# Patient Record
Sex: Female | Born: 1975 | Race: White | Hispanic: No | Marital: Married | Smoking: Former smoker
Health system: Southern US, Community
[De-identification: ages and names within clinical notes are randomized; demographics above are authoritative.]

## PROBLEM LIST (undated history)

## (undated) DIAGNOSIS — C801 Malignant (primary) neoplasm, unspecified: Secondary | ICD-10-CM

## (undated) HISTORY — PX: TONSILLECTOMY: SUR1361

## (undated) HISTORY — PX: LEFT OOPHORECTOMY: SHX1961

## (undated) HISTORY — PX: APPENDECTOMY: SHX54

## (undated) HISTORY — PX: DILATION AND CURETTAGE OF UTERUS: SHX78

---

## 2012-12-15 ENCOUNTER — Encounter: Payer: Self-pay | Admitting: Maternal and Fetal Medicine

## 2013-02-09 ENCOUNTER — Encounter: Payer: Self-pay | Admitting: Maternal and Fetal Medicine

## 2013-04-02 LAB — PIH PROFILE
Anion Gap: 9 (ref 7–16)
BUN: 4 mg/dL — ABNORMAL LOW (ref 7–18)
Calcium, Total: 8.9 mg/dL (ref 8.5–10.1)
Co2: 20 mmol/L — ABNORMAL LOW (ref 21–32)
Creatinine: 0.56 mg/dL — ABNORMAL LOW (ref 0.60–1.30)
EGFR (African American): >60
Glucose: 91 mg/dL (ref 65–99)
HGB: 12.8 g/dL (ref 12.0–16.0)
MCH: 33 pg (ref 26.0–34.0)
MCHC: 35.7 g/dL (ref 32.0–36.0)
MCV: 93 fL (ref 80–100)
Osmolality: 270 (ref 275–301)
Potassium: 3.4 mmol/L — ABNORMAL LOW (ref 3.5–5.1)
RBC: 3.86 10*6/uL (ref 3.80–5.20)
RDW: 14.2 % (ref 11.5–14.5)
SGOT(AST): 12 U/L — ABNORMAL LOW (ref 15–37)
Uric Acid: 4.6 mg/dL (ref 2.6–6.0)

## 2013-04-02 LAB — PROTEIN / CREATININE RATIO, URINE: Creatinine, Urine: 131.6 mg/dL — ABNORMAL HIGH (ref 30.0–125.0)

## 2013-04-04 ENCOUNTER — Inpatient Hospital Stay: Payer: Self-pay | Admitting: Obstetrics and Gynecology

## 2013-04-04 LAB — CBC WITH DIFFERENTIAL/PLATELET
Basophil #: 0.1 10*3/uL (ref 0.0–0.1)
Basophil %: 0.9 %
Eosinophil #: 0.1 10*3/uL (ref 0.0–0.7)
Eosinophil %: 1 %
HCT: 38.1 % (ref 35.0–47.0)
Lymphocyte #: 2.3 10*3/uL (ref 1.0–3.6)
MCH: 32.9 pg (ref 26.0–34.0)
MCV: 93 fL (ref 80–100)
Monocyte #: 0.4 x10 3/mm (ref 0.2–0.9)
Monocyte %: 3.9 %
Neutrophil #: 7.5 10*3/uL — ABNORMAL HIGH (ref 1.4–6.5)
Platelet: 168 10*3/uL (ref 150–440)
RBC: 4.1 10*6/uL (ref 3.80–5.20)
RDW: 14.1 % (ref 11.5–14.5)

## 2013-04-04 LAB — PROTEIN, URINE, 24 HOUR
Collection Hours: 24 hours
Protein, 24 Hour Urine: 378 mg/24HR — ABNORMAL HIGH (ref 30–149)
Protein, Urine: 18 mg/dL (ref 0–12)

## 2013-04-07 LAB — PATHOLOGY REPORT

## 2013-10-14 ENCOUNTER — Ambulatory Visit: Payer: Self-pay | Admitting: Physician Assistant

## 2013-10-14 LAB — RAPID STREP-A WITH REFLX: Micro Text Report: NEGATIVE

## 2013-10-15 LAB — BETA STREP CULTURE(ARMC)

## 2014-05-07 LAB — OB RESULTS CONSOLE HEPATITIS B SURFACE ANTIGEN: HEP B S AG: NEGATIVE

## 2014-05-07 LAB — OB RESULTS CONSOLE ABO/RH: RH TYPE: NEGATIVE

## 2014-05-07 LAB — OB RESULTS CONSOLE VARICELLA ZOSTER ANTIBODY, IGG: VARICELLA IGG: IMMUNE

## 2014-05-07 LAB — OB RESULTS CONSOLE RUBELLA ANTIBODY, IGM: RUBELLA: IMMUNE

## 2014-06-04 NOTE — L&D Delivery Note (Signed)
Delivery Note At 8:20 AM a viable female was delivered via C-Section, Low Transverse (Presentation: cephalic).  APGAR: 9, 9; weight 9 lb 4.5 oz (4210 g).   Placenta status: Intact, Manual removal.  Cord: 3 vessels with the following complications: None.  Cord pH: not obtained  Anesthesia: Spinal  Episiotomy:   Lacerations:   Suture Repair: n/a Est. Blood Loss (mL):  800 mL  Mom to postpartum.  Baby to Couplet care / Skin to Skin.  Will Bonnet, MD, [place] 12/07/2014 9:38 AM

## 2014-09-24 NOTE — Op Note (Signed)
PATIENT NAME:  Marissa Rose, Marissa Rose MR#:  [id number] DATE OF BIRTH:  05-01-1976  DATE OF PROCEDURE:  04/04/2013  PREOPERATIVE DIAGNOSIS: Prior cesarean section and mild preeclampsia. Down syndrome.   POSTOPERATIVE DIAGNOSIS: Prior cesarean section and mild preeclampsia. Down syndrome.   PROCEDURE: Repeat low transverse cesarean section.   ANESTHESIA:  Spinal.  SURGEON: Donzetta Matters, M.D.   ASSISTANT: Roslyn Smiling, scrub tech.   ESTIMATED BLOOD LOSS: 600 mL   FLUIDS: 1500 mL.   COMPLICATIONS: None.   FINDINGS: Double footling breech female infant with Down syndrome features, 3147 grams, Apgars 7 and 9. Dense adhesions of anterior uterine serosa to bladder and anterior abdominal wall, requiring sharp and blunt dissection.   SPECIMEN: Placenta.   INDICATIONS: The patient is a 39 year old who presents to the hospital with elevated blood pressure. She was monitored, and 24-hour urine was collected, which resulted at 378, indicating a diagnosis of mild preeclampsia. Decision was made to proceed towards delivery because of this. Risks, benefits, indications, and alternatives of the procedure were explained, and informed consent was obtained.   PROCEDURE: The patient was taken to the Operating Room with IV fluids running. She was prepped and draped in the usual sterile fashion with leftward tilt. A Pfannenstiel skin incision was made, carried down to underlying fascia with the knife. The fascia was nicked in the midline. The incision was extended laterally. The superior aspect of the fascia was grasped with Kocher clamps, and the underlying rectus muscles were dissected off with difficulty, using Mayo scissors. This was repeated on the inferior fascia.   The rectus muscles were somewhat divided in the midline at the time of the removal of the underlying rectus muscles off from the superior fascia. Kelly clamps were placed on the underlying peritoneum, and the peritoneum was entered sharply with  the Metzenbaum scissors. The opening was extended with some difficulty, secondary to prior adhesions. The uterus was somewhat clear of adhesions superiorly, however, at the lower uterine segment, the uterine serosa was severely adhesed to the anterior abdominal wall as well as the bladder.  The vesicouterine peritoneum was identified with difficulty and entered with the Metzenbaums, and the bladder flap was created digitally. The bladder blade was then placed. The hysterotomy incision was made. The placenta was encountered, as was a fair amount of bleeding. The infant's  feet were grasped as the membranes were ruptured. The feet were delivered and the infant was pulled out and delivered to the inferior border of the scapula. The left arm was delivered, and the infant was rotated 180 degrees, and the right arm was delivered.   The maxillary prominence was grasped. The neck was flexed, and the infant's head was delivered. It was at this time that Down syndrome features were noted. The cord was clamped x2 and cut and the infant was handed to the waiting nursery staff. The cord blood was collected. The placenta was expressed. The uterus was cleared of all clot and debris after being exteriorized.  The hysterotomy incision was repaired in a running locked fashion with a #0 Monocryl. The serosa the anterior uterus had been somewhat sheared off secondary to adhesions, and this was repaired in two layers using a #0 Monocryl in a running fashion. All surgical areas were examined and were found to be hemostatic. The uterus was returned to the abdomen. The abdomen and gutters were irrigated with copious amounts of warm normal saline. A piece of Interceed was placed to help prevent further adhesion formation.   The  rectus muscles were reapproximated with #2-0 Vicryl. The On-Q pump apparatus was placed according to manufacturer's instructions. The PDS was used to close the fascia. This was also done with some difficulty,  secondary to ragged edges of the fascia because of adhesions on the right side of the incision, and opening remained in the fascia, and this was eventually closed using a second #1 PDS suture.   The On-Q pump catheters were each bolused with 5 mL with 0.5% bupivacaine. The openings through which the On-Q catheters exited the abdomen were sealed using Dermabond skin glue. The subcutaneous fat was reapproximated using #2 plain gut in an interrupted fashion, and the skin was closed with staples. The On-Q pump catheters were secured to the patient's abdomen using Steri-Strips and Tegaderm. The patient tolerated the procedure well. Sponge, needle and instrument counts were correct x2. The patient was taken to the recovery room in stable condition.    ____________________________ Marissa Gala Ferne Reus, MD law:cg D: 04/04/2013 18:41:15 ET T: 04/05/2013 00:36:26 ET JOB#: [id number]  cc: Sherlynn Carbon A. Ferne Reus, MD, <Dictator> Renda Rolls LEE MD ELECTRONICALLY SIGNED 04/07/2013 13:36

## 2014-09-28 LAB — OB RESULTS CONSOLE HIV ANTIBODY (ROUTINE TESTING): HIV: NONREACTIVE

## 2014-09-28 LAB — OB RESULTS CONSOLE RPR: RPR: NONREACTIVE

## 2014-10-12 NOTE — H&P (Signed)
L&D Evaluation:  History:  HPI 39 year old G5 P2022 with EDC=04/25/2013 by a 10wk5 day ultrasound presents with c/o headache x 24 hours unrelieved with Tylenol and elevated blood pressures at the pharmacy tonight to 150s/90s. Headache is in parietal area bilaterally and is accompanied by photophobia and nausea and lightheadedness. Rated headache pain 5/10 on arrival and 2/10 after 2 Vicodin. No scotomata. Complains of edema and weight gain recently. No hx of migraines. Has had a little sinus congestion, but no facial pain/ sore throat.Baby active.Prenatal care also remarkable for hx of prior C-section x2, AMA, a NT of 4.6 mm, with normal anatomy scan, obesity, and an elevated glucola of 159 with two normal 3hr GTTs. There were concerns for possible vasa previa on earlier ultraosund but scan on 09/08 did not show any signs of a vasa previa, but there is an accessory lobe of the placenta posteriorly.   Presents with headache and elevated blood pressures   Patient's Medical History No Chronic Illness    Patient's Surgical History Appendectomy  D&C  Previous C-Section  oophorectomy, wisdom teeth extraction.   Medications Pre Natal Vitamins  folic acid, Baby ASA, Tylenol 500 mgm (last dose 1 PM), Tums   Allergies Sulfa   Social History none   Family History Non-Contributory   ROS:  ROS see HPI   Exam:  Vital Signs 130s-140s/70s to 80s   Urine Protein 1+, P/C=274 mgm   General no apparent distress   Mental Status clear   Chest clear   Heart normal sinus rhythm, no murmur/gallop/rubs   Abdomen gravid, non-tender   Estimated Fetal Weight Average for gestational age   Fetal Position breech   Edema 1+   Reflexes +2 to +3   Clonus negative   Mebranes Intact, AFI=13cm+   FHT normal rate with no decels   Ucx rare   Impression:  Impression IUP at 36 5/7 weeks with gestational hypertension. R/O preeclampsia. Prior C-section x2   Plan:  Plan Discussed case with Dr  Glennon Mac.   Electronic Signatures: Karene Fry (CNM)  (Signed 30-Oct-14 22:55)  Authored: L&D Evaluation   Last Updated: 30-Oct-14 22:55 by Karene Fry (CNM)

## 2014-11-17 LAB — OB RESULTS CONSOLE GBS: GBS: NEGATIVE

## 2014-12-03 ENCOUNTER — Encounter
Admission: RE | Admit: 2014-12-03 | Discharge: 2014-12-03 | Disposition: A | Discharge status: Home / Self Care | Payer: 59 | Source: Ambulatory Visit | Attending: Obstetrics and Gynecology | Admitting: Obstetrics and Gynecology

## 2014-12-03 DIAGNOSIS — Z01812 Encounter for preprocedural laboratory examination: Secondary | ICD-10-CM | POA: Insufficient documentation

## 2014-12-03 HISTORY — DX: Malignant (primary) neoplasm, unspecified: C80.1

## 2014-12-03 LAB — CBC
HCT: 36.2 % (ref 35.0–47.0)
HEMOGLOBIN: 12.7 g/dL (ref 12.0–16.0)
MCH: 33.4 pg (ref 26.0–34.0)
MCHC: 35.1 g/dL (ref 32.0–36.0)
MCV: 95.3 fL (ref 80.0–100.0)
Platelets: 177 10*3/uL (ref 150–440)
RBC: 3.8 MIL/uL (ref 3.80–5.20)
RDW: 14 % (ref 11.5–14.5)
WBC: 9 10*3/uL (ref 3.6–11.0)

## 2014-12-03 LAB — RAPID HIV SCREEN (HIV 1/2 AB+AG)
HIV 1/2 ANTIBODIES: NONREACTIVE
HIV-1 P24 ANTIGEN - HIV24: NONREACTIVE

## 2014-12-03 LAB — ABO/RH: ABO/RH(D): A NEG

## 2014-12-03 NOTE — Patient Instructions (Signed)
Marissa Rose   Your procedure is scheduled on: 12/07/14   Report to [facility] at 5:30 AM.             [facility]             [place], [place] [address]  Call this number if you have problems the morning of surgery: [phone]   Remember:   Do not eat food or drink liquids after midnight.     Do not wear jewelry, make-up or nail polish.  Do not wear lotions, powders, or perfumes. You may wear deodorant.  Do not shave prior to surgery.  Do not bring valuables to the hospital.  [facility] is not responsible for any                        belongings or valuables.                Contacts, dentures or bridgework may not be worn into surgery.  Leave suitcase in the car. After surgery it may be brought to your room.  For patients admitted to the hospital, discharge time is determined by your             treatment team.               Special Instructions: Preparing the skin before Cesarean Section              To help prevent the risk of infection at your surgical site, we are providing             you with rinse-free Sage 2% Chlorhexidine Gluconate (HCG) disposable             wipes.               The night before surgery:              1. Shower or bathe with warm water             2. Do not apply lotion or perfume             3. Wait one hour after shower, skin should be dry and cool             4. Open Sage wipe package - 2 disposable cloths are inside             5. Wipe the lower abdomen from the pubic line to the navel and hip bone to hip             bone with one cloth             6. Use the second cloth to wipe the front of the upper thighs             7. Allow the area to dry for one minute. DO NOT RINSE             8. Skin may feel "tacky" for several minutes             9. Dress in freshly laundered, clean clothes           10. Do not shower the morning of surgery      Cesarean Delivery  Cesarean  delivery is the birth of a baby through a  cut (incision) in the abdomen and womb (uterus).  LET [facility] CARE PROVIDER KNOW ABOUT:  All medicines you are taking, including vitamins, herbs, eye drops, creams, and over-the-counter medicines.  Previous problems you or members of your family have had with the use of anesthetics.  Any blood disorders you have.  Previous surgeries you have had.  Medical conditions you have.  Any allergies you have.  Complicationsinvolving the pregnancy. RISKS AND COMPLICATIONS  Generally, this is a safe procedure. However, as with any procedure, complications can occur. Possible complications include:  Bleeding.  Infection.  Blood clots.  Injury to surrounding organs.  Problems with anesthesia.  Injury to the baby. BEFORE THE PROCEDURE   You may be given an antacid medicine to drink. This will prevent acid contents in your stomach from going into your lungs if you vomit during the surgery.  You may be given an antibiotic medicine to prevent infection. PROCEDURE   Hair may be removed from your pubic area and your lower abdomen. This is to prevent infection in the incision site.  A tube (Foley catheter) will be placed in your bladder to drain your urine from your bladder into a bag. This keeps your bladder empty during surgery.  An IV tube will be placed in your vein.  You may be given medicine to numb the lower half of your body (regional anesthetic). If you were in labor, you may have already had an epidural in place which can be used in both labor and cesarean delivery. You may possibly be given medicine to make you sleep (general anesthetic) though this is not as common.  An incision will be made in your abdomen that extends to your uterus. There are 2 basic kinds of incisions:  The horizontal (transverse) incision. Horizontal incisions are from side to side and are used for most routine cesarean deliveries.  The vertical incision.  The vertical incision is from the top of the abdomen to the bottom and is less commonly used. It is often done for women who have a serious complication (extreme prematurity) or under emergency situations.  The horizontal and vertical incisions may both be used at the same time. However, this is very uncommon.  An incision is then made in your uterus to deliver the baby.  Your baby will then be delivered.  Both incisions are then closed with absorbable stitches. AFTER THE PROCEDURE   If you were awake during the surgery, you will see your baby right away. If you were asleep, you will see your baby as soon as you are awake.  You may breastfeed your baby after surgery.  You may be able to get up and walk the same day as the surgery. If you need to stay in bed for a period of time, you will receive help to turn, cough, and take deep breaths after surgery. This helps prevent lung problems such as pneumonia.  Do not get out of bed alone the first time after surgery. You will need help getting out of bed until you are able to do this by yourself.  You may be able to shower the day after your cesarean delivery. After the bandage (dressing) is taken off the incision site, a nurse will assist you to shower if you would like help.  You will have pneumatic compression hose placed on your lower legs. This is done to prevent blood clots. When you are up and walking regularly, they will no longer be necessary.  Do not cross  your legs when you sit.  Save any blood clots that you pass. If you pass a clot while on the toilet, do not flush it. Call for the nurse. Tell the nurse if you think you are bleeding too much or passing too many clots.  You will be given medicine as needed. Let your health care providers know if you are hurting. You may also be given an antibiotic to prevent an infection.  Your IV tube will be taken out when you are drinking a reasonable amount of fluids. The Foley catheter is  taken out when you are up and walking.  If your blood type is Rh negative and your baby's blood type is Rh positive, you will be given a shot of anti-D immune globulin. This shot prevents you from having Rh problems with a future pregnancy. You should get the shot even if you had your tubes tied (tubal ligation).  If you are allowed to take the baby for a walk, place the baby in the bassinet and push it. Do not carry your baby in your arms. Document Released: 05/21/2005 Document Revised: 03/11/2013 Document Reviewed: 12/10/2012 [employer] [facility] Patient Information 2015 [place], [place]. This information is not intended to replace advice given to you by your health care provider. Make sure you discuss any questions you have with your health care provider.

## 2014-12-04 LAB — RPR: RPR Ser Ql: NONREACTIVE

## 2014-12-07 ENCOUNTER — Inpatient Hospital Stay: Payer: 59 | Admitting: Certified Registered"

## 2014-12-07 ENCOUNTER — Encounter
Admission: RE | Disposition: A | Discharge status: Home / Self Care | Payer: Self-pay | Source: Ambulatory Visit | Attending: Obstetrics and Gynecology

## 2014-12-07 ENCOUNTER — Encounter: Payer: Self-pay | Admitting: Anesthesiology

## 2014-12-07 ENCOUNTER — Inpatient Hospital Stay
Admission: RE | Admit: 2014-12-07 | Discharge: 2014-12-10 | DRG: 766 | Disposition: A | Discharge status: Home / Self Care | Payer: 59 | Source: Ambulatory Visit | Attending: Obstetrics and Gynecology | Admitting: Obstetrics and Gynecology

## 2014-12-07 DIAGNOSIS — O0990 Supervision of high risk pregnancy, unspecified, unspecified trimester: Secondary | ICD-10-CM

## 2014-12-07 DIAGNOSIS — O3421 Maternal care for scar from previous cesarean delivery: Principal | ICD-10-CM | POA: Diagnosis present

## 2014-12-07 DIAGNOSIS — Z302 Encounter for sterilization: Secondary | ICD-10-CM | POA: Diagnosis not present

## 2014-12-07 DIAGNOSIS — O34219 Maternal care for unspecified type scar from previous cesarean delivery: Secondary | ICD-10-CM | POA: Diagnosis present

## 2014-12-07 DIAGNOSIS — O99213 Obesity complicating pregnancy, third trimester: Secondary | ICD-10-CM | POA: Diagnosis present

## 2014-12-07 DIAGNOSIS — E669 Obesity, unspecified: Secondary | ICD-10-CM | POA: Diagnosis present

## 2014-12-07 DIAGNOSIS — Z3A39 39 weeks gestation of pregnancy: Secondary | ICD-10-CM | POA: Diagnosis present

## 2014-12-07 DIAGNOSIS — O99214 Obesity complicating childbirth: Secondary | ICD-10-CM | POA: Diagnosis present

## 2014-12-07 DIAGNOSIS — Z6837 Body mass index (BMI) 37.0-37.9, adult: Secondary | ICD-10-CM | POA: Diagnosis not present

## 2014-12-07 DIAGNOSIS — Z98891 History of uterine scar from previous surgery: Secondary | ICD-10-CM

## 2014-12-07 DIAGNOSIS — N858 Other specified noninflammatory disorders of uterus: Secondary | ICD-10-CM | POA: Diagnosis present

## 2014-12-07 LAB — CBC
HCT: 34.6 % — ABNORMAL LOW (ref 35.0–47.0)
Hemoglobin: 12.3 g/dL (ref 12.0–16.0)
MCH: 33.9 pg (ref 26.0–34.0)
MCHC: 35.5 g/dL (ref 32.0–36.0)
MCV: 95.5 fL (ref 80.0–100.0)
PLATELETS: 166 10*3/uL (ref 150–440)
RBC: 3.62 MIL/uL — AB (ref 3.80–5.20)
RDW: 13.7 % (ref 11.5–14.5)
WBC: 8.3 10*3/uL (ref 3.6–11.0)

## 2014-12-07 SURGERY — Surgical Case
Anesthesia: Spinal | Laterality: Right | Wound class: Clean Contaminated

## 2014-12-07 SURGERY — Surgical Case

## 2014-12-07 MED ORDER — CITRIC ACID-SODIUM CITRATE 334-500 MG/5ML PO SOLN
30.0000 mL | Freq: Once | ORAL | Status: AC
  Administered 2014-12-07: 30 mL via ORAL

## 2014-12-07 MED ORDER — FENTANYL CITRATE (PF) 100 MCG/2ML IJ SOLN
INTRAMUSCULAR | Status: AC
  Administered 2014-12-07: 25 ug via INTRAVENOUS
  Filled 2014-12-07: qty 2

## 2014-12-07 MED ORDER — BUPIVACAINE 0.25 % ON-Q PUMP DUAL CATH 400 ML: 400.0000 mL | INJECTION | Status: DC

## 2014-12-07 MED ORDER — MENTHOL 3 MG MT LOZG
1.0000 | LOZENGE | OROMUCOSAL | Status: DC | PRN
  Filled 2014-12-07: qty 9

## 2014-12-07 MED ORDER — LACTATED RINGERS IV SOLN
INTRAVENOUS | Status: DC
  Administered 2014-12-07 – 2014-12-08 (×2): via INTRAVENOUS

## 2014-12-07 MED ORDER — DIPHENHYDRAMINE HCL 25 MG PO CAPS
25.0000 mg | ORAL_CAPSULE | Freq: Four times a day (QID) | ORAL | Status: DC | PRN
  Administered 2014-12-07: 25 mg via ORAL
  Filled 2014-12-07: qty 1

## 2014-12-07 MED ORDER — BUPIVACAINE HCL 0.5 % IJ SOLN: 5.0000 mL | Freq: Once | INTRAMUSCULAR | Status: DC

## 2014-12-07 MED ORDER — WITCH HAZEL-GLYCERIN EX PADS
1.0000 "application " | MEDICATED_PAD | CUTANEOUS | Status: DC | PRN
  Administered 2014-12-08: 1 via TOPICAL
  Filled 2014-12-07: qty 100

## 2014-12-07 MED ORDER — LANOLIN HYDROUS EX OINT: 1.0000 "application " | TOPICAL_OINTMENT | CUTANEOUS | Status: DC | PRN

## 2014-12-07 MED ORDER — BUPIVACAINE HCL (PF) 0.5 % IJ SOLN
INTRAMUSCULAR | Status: AC
  Filled 2014-12-07: qty 30

## 2014-12-07 MED ORDER — OXYTOCIN 40 UNITS IN LACTATED RINGERS INFUSION - SIMPLE MED: 62.5000 mL/h | INTRAVENOUS | Status: AC

## 2014-12-07 MED ORDER — ONDANSETRON HCL 4 MG/2ML IJ SOLN
INTRAMUSCULAR | Status: DC | PRN
  Administered 2014-12-07: 4 mg via INTRAVENOUS

## 2014-12-07 MED ORDER — CEFAZOLIN SODIUM-DEXTROSE 2-3 GM-% IV SOLR
INTRAVENOUS | Status: AC
  Administered 2014-12-07: 2 g via INTRAVENOUS
  Filled 2014-12-07: qty 50

## 2014-12-07 MED ORDER — LACTATED RINGERS IV SOLN
INTRAVENOUS | Status: DC | PRN
  Administered 2014-12-07 (×2): via INTRAVENOUS

## 2014-12-07 MED ORDER — PRENATAL MULTIVITAMIN CH
1.0000 | ORAL_TABLET | Freq: Every day | ORAL | Status: DC
  Administered 2014-12-07 – 2014-12-09 (×3): 1 via ORAL
  Filled 2014-12-07 (×3): qty 1

## 2014-12-07 MED ORDER — PHENYLEPHRINE HCL 10 MG/ML IJ SOLN
INTRAMUSCULAR | Status: DC | PRN
  Administered 2014-12-07 (×10): 100 ug via INTRAVENOUS

## 2014-12-07 MED ORDER — SIMETHICONE 80 MG PO CHEW
80.0000 mg | CHEWABLE_TABLET | Freq: Three times a day (TID) | ORAL | Status: DC
  Administered 2014-12-07 – 2014-12-10 (×4): 80 mg via ORAL
  Filled 2014-12-07 (×4): qty 1

## 2014-12-07 MED ORDER — OXYTOCIN 40 UNITS IN LACTATED RINGERS INFUSION - SIMPLE MED
INTRAVENOUS | Status: DC | PRN
  Administered 2014-12-07: 500 mL via INTRAVENOUS

## 2014-12-07 MED ORDER — BUPIVACAINE 0.25 % ON-Q PUMP DUAL CATH 400 ML
INJECTION | Status: AC
  Filled 2014-12-07: qty 400

## 2014-12-07 MED ORDER — HYDROCODONE-ACETAMINOPHEN 5-325 MG PO TABS: 1.0000 | ORAL_TABLET | ORAL | Status: DC | PRN

## 2014-12-07 MED ORDER — ONDANSETRON HCL 4 MG/2ML IJ SOLN: 4.0000 mg | Freq: Once | INTRAMUSCULAR | Status: DC | PRN

## 2014-12-07 MED ORDER — BUPIVACAINE IN DEXTROSE 0.75-8.25 % IT SOLN
INTRATHECAL | Status: DC | PRN
  Administered 2014-12-07: 1.7 mL via INTRATHECAL

## 2014-12-07 MED ORDER — CITRIC ACID-SODIUM CITRATE 334-500 MG/5ML PO SOLN
ORAL | Status: AC
  Administered 2014-12-07: 30 mL via ORAL
  Filled 2014-12-07: qty 15

## 2014-12-07 MED ORDER — CEFAZOLIN SODIUM-DEXTROSE 2-3 GM-% IV SOLR
2.0000 g | INTRAVENOUS | Status: AC
  Administered 2014-12-07: 2 g via INTRAVENOUS

## 2014-12-07 MED ORDER — DIBUCAINE 1 % RE OINT
1.0000 | TOPICAL_OINTMENT | RECTAL | Status: DC | PRN
Start: 2014-12-07 — End: 2014-12-10

## 2014-12-07 MED ORDER — HYDROCODONE-ACETAMINOPHEN 5-325 MG PO TABS
1.0000 | ORAL_TABLET | ORAL | Status: DC | PRN
  Administered 2014-12-07: 2 via ORAL
  Administered 2014-12-07: 1 via ORAL
  Administered 2014-12-08 – 2014-12-10 (×13): 2 via ORAL
  Filled 2014-12-07 (×8): qty 2
  Filled 2014-12-07: qty 1
  Filled 2014-12-07 (×6): qty 2

## 2014-12-07 MED ORDER — SENNOSIDES-DOCUSATE SODIUM 8.6-50 MG PO TABS
2.0000 | ORAL_TABLET | ORAL | Status: DC
  Administered 2014-12-08 – 2014-12-09 (×2): 2 via ORAL
  Filled 2014-12-07 (×3): qty 2

## 2014-12-07 MED ORDER — MORPHINE SULFATE (PF) 0.5 MG/ML IJ SOLN
INTRAMUSCULAR | Status: DC | PRN
  Administered 2014-12-07: .2 mg via EPIDURAL

## 2014-12-07 MED ORDER — FENTANYL CITRATE (PF) 100 MCG/2ML IJ SOLN
25.0000 ug | INTRAMUSCULAR | Status: DC | PRN
  Administered 2014-12-07 (×3): 25 ug via INTRAVENOUS

## 2014-12-07 MED ORDER — IBUPROFEN 600 MG PO TABS
600.0000 mg | ORAL_TABLET | Freq: Four times a day (QID) | ORAL | Status: DC | PRN
  Administered 2014-12-07 – 2014-12-10 (×10): 600 mg via ORAL
  Filled 2014-12-07 (×10): qty 1

## 2014-12-07 SURGICAL SUPPLY — 30 items
CANISTER SUCT 3000ML (MISCELLANEOUS) ×3 IMPLANT
CATH KIT ON-Q SILVERSOAK 5IN (CATHETERS) ×6 IMPLANT
CHLORAPREP W/TINT 26ML (MISCELLANEOUS) ×6 IMPLANT
CLOSURE WOUND 1/2 X4 (GAUZE/BANDAGES/DRESSINGS) ×1
CUP MEDICINE 2OZ PLAST GRAD ST (MISCELLANEOUS) ×3 IMPLANT
DRSG TELFA 3X8 NADH (GAUZE/BANDAGES/DRESSINGS) ×3 IMPLANT
GAUZE SPONGE 4X4 12PLY STRL (GAUZE/BANDAGES/DRESSINGS) ×3 IMPLANT
GLOVE BIO SURGEON STRL SZ8 (GLOVE) ×6 IMPLANT
GLOVE BIOGEL PI IND STRL 7.5 (GLOVE) ×2 IMPLANT
GLOVE BIOGEL PI INDICATOR 7.5 (GLOVE) ×4
GLOVE SKINSENSE NS SZ7.0 (GLOVE) ×4
GLOVE SKINSENSE STRL SZ7.0 (GLOVE) ×2 IMPLANT
GOWN STRL REUS W/ TWL LRG LVL3 (GOWN DISPOSABLE) ×2 IMPLANT
GOWN STRL REUS W/TWL LRG LVL3 (GOWN DISPOSABLE) ×4
LIQUID BAND (GAUZE/BANDAGES/DRESSINGS) ×3 IMPLANT
NDL SAFETY 22GX1.5 (NEEDLE) ×3 IMPLANT
NS IRRIG 1000ML POUR BTL (IV SOLUTION) ×3 IMPLANT
PACK C SECTION AR (MISCELLANEOUS) ×3 IMPLANT
PAD GROUND ADULT SPLIT (MISCELLANEOUS) ×3 IMPLANT
PAD OB MATERNITY 4.3X12.25 (PERSONAL CARE ITEMS) ×6 IMPLANT
PAD PREP 24X41 OB/GYN DISP (PERSONAL CARE ITEMS) ×3 IMPLANT
STRIP CLOSURE SKIN 1/2X4 (GAUZE/BANDAGES/DRESSINGS) ×2 IMPLANT
SUT 2-0 PL GUT LIGAPAK (SUTURE) ×3 IMPLANT
SUT MAXON ABS #0 GS21 30IN (SUTURE) IMPLANT
SUT MNCRL AB 4-0 PS2 18 (SUTURE) IMPLANT
SUT PLAIN 2 0 XLH (SUTURE) ×3 IMPLANT
SUT VIC AB 0 CT1 36 (SUTURE) ×12 IMPLANT
SUT VIC AB 1 CT1 36 (SUTURE) IMPLANT
SWABSTK COMLB BENZOIN TINCTURE (MISCELLANEOUS) ×3 IMPLANT
SYRINGE 10CC LL (SYRINGE) ×3 IMPLANT

## 2014-12-07 SURGICAL SUPPLY — 27 items
CANISTER SUCT 3000ML (MISCELLANEOUS) IMPLANT
CATH KIT ON-Q SILVERSOAK 5IN (CATHETERS) IMPLANT
CLOSURE WOUND 1/2 X4 (GAUZE/BANDAGES/DRESSINGS)
DRSG TELFA 3X8 NADH (GAUZE/BANDAGES/DRESSINGS) IMPLANT
ELECT CAUTERY BLADE 6.4 (BLADE) IMPLANT
GAUZE SPONGE 4X4 12PLY STRL (GAUZE/BANDAGES/DRESSINGS) IMPLANT
GLOVE BIO SURGEON STRL SZ7 (GLOVE) IMPLANT
GLOVE INDICATOR 7.5 STRL GRN (GLOVE) IMPLANT
GOWN STRL REUS W/ TWL LRG LVL3 (GOWN DISPOSABLE) IMPLANT
GOWN STRL REUS W/TWL LRG LVL3 (GOWN DISPOSABLE)
LIQUID BAND (GAUZE/BANDAGES/DRESSINGS) IMPLANT
NS IRRIG 1000ML POUR BTL (IV SOLUTION) IMPLANT
PACK C SECTION AR (MISCELLANEOUS) IMPLANT
PAD GROUND ADULT SPLIT (MISCELLANEOUS) IMPLANT
PAD OB MATERNITY 4.3X12.25 (PERSONAL CARE ITEMS) IMPLANT
PAD PREP 24X41 OB/GYN DISP (PERSONAL CARE ITEMS) IMPLANT
SPONGE LAP 18X18 5 PK (GAUZE/BANDAGES/DRESSINGS) IMPLANT
STRIP CLOSURE SKIN 1/2X4 (GAUZE/BANDAGES/DRESSINGS) IMPLANT
SUT CHROMIC GUT BROWN 0 54 (SUTURE) IMPLANT
SUT CHROMIC GUT BROWN 0 54IN (SUTURE)
SUT MNCRL 4-0 (SUTURE)
SUT MNCRL 4-0 27XMFL (SUTURE)
SUT PDS AB 1 TP1 96 (SUTURE) IMPLANT
SUT PLAIN 2 0 XLH (SUTURE) IMPLANT
SUT VIC AB 0 CT1 36 (SUTURE) IMPLANT
SUTURE MNCRL 4-0 27XMF (SUTURE) IMPLANT
SWABSTK COMLB BENZOIN TINCTURE (MISCELLANEOUS) IMPLANT

## 2014-12-07 NOTE — Op Note (Addendum)
Cesarean Section Procedure Note   Marissa Rose   12/07/2014   Pre-operative Diagnosis: 1) intrauterine pregnancy at [redacted]w[redacted]d gestational age, 2) history of prior cesarean delivery, desires repeat, 3) obesity with bmi of 36, 4) multiparity, desires permanent sterility   Post-operative Diagnosis: 1) intrauterine pregnancy at [redacted]w[redacted]d gestational age, 2) history of prior cesarean delivery, desires repeat, 3) obesity with bmi of 49, 4) multiparity, desires permanent sterility   Procedures: 1) Repeat low transverse cesarean section 2) right tubal ligation  Surgeon: Surgeon(s) and Role:    * Will Bonnet, MD - Primary    * Aletha Halim, MD - Assisting   Anesthesia: spinal   Findings:  1) normal appearing gravid uterus with some mild adhesions of bladder to lower uterine segment, mild adhesions of peritoneum to left uterus, absent left fallopian tube with normal appearing right fallopian tube, and normal appearing right ovary 2) viable female infant   Estimated Blood Loss: 800 mL  Total IV Fluids: 1,500 ml   Urine Output: 200 mL clear urine at end of procedure  Specimens: segment of right fallopian tube for permanent  Complications: no complications  Disposition: PACU - hemodynamically stable.   Maternal Condition: stable   Baby condition / location:  Couplet care / Skin to Skin  Procedure Details:  The patient was seen in the Holding Room. The risks, benefits, complications, treatment options, and expected outcomes were discussed with the patient. The patient concurred with the proposed plan, giving informed consent. identified as Marissa Rose and the procedure verified as C-Section Delivery and tubal ligation. A Time Out was held and the above information confirmed.   After induction of anesthesia, the patient was draped and prepped in the usual sterile manner. A Pfannenstiel incision was made and carried down through the subcutaneous tissue to the fascia. Fascial incision was  made and extended transversely. The fascia was separated from the underlying rectus tissue superiorly and inferiorly. The peritoneum was identified and entered. Peritoneal incision was extended longitudinally. The bladder flap was sharply dissected from the lower uterine segment. A low transverse uterine incision was made and the hysterotomy was extended with cranial-caudal tension. Delivered from cephalic presentation was a 4,210 gram Living newborn infant(s) with Apgar scores of 9 at one minute and 9 at five minutes. Cord ph was not sent the umbilical cord was clamped and cut cord blood was obtained for evaluation. The placenta was removed Intact and appeared normal. The uterine outline, tubes and ovaries appeared as noted above. The uterine incision was closed with running locked sutures of 0 Vicryl.  A second layer of the same suture was thrown in an imbricating fashion.  Hemostasis was assured.   Attention was turned to the left adnexa, where no left fallopian tube could be identified. There was some mild adhesive disease noted along the left adnexa and it was sharply dissected to assess for a left fallopian tube (though this was below where the fallopian tube might be expected).  No fallopian tube was noted.  So, attention was turned to the right fallopian tube where a Babcock clamped was used to elevate the tube in the mid-isthmic region and a 3cm segment was ligated using two #0 plain gut free ties. An approximately 3cm segment of fallopian tube was removed and hemostasis was noted.    The hysterotomy was inspected and continued hemostasis was noted. The uterus was retained to the abdomen and the paracolic gutters were cleared of all clots and debris.  The rectus muscles were  inspected and found to be hemostatic.  The fascia was reapproximated with running sutures of 1-0 PDS, looped. The subcutaneous tissue was reapproximated using 2-0 plain gut such that no greater than 2cm of dead space remained. The  skin was closed with staples.   Instrument, sponge, and needle counts were correct prior the abdominal closure and were correct at the conclusion of the case.  The patient received Ancef 2 gram IV prior to skin incision (within 30 minutes). For VTE prophylaxis she was wearing SCDs throughout the case.  She was transported to the recovery area in stable condition.   Signed: Will Bonnet, MD, [place] 12/07/2014 9:19 AM

## 2014-12-07 NOTE — Anesthesia Procedure Notes (Signed)
Spinal Patient location during procedure: OB Start time: 12/07/2014 7:40 AM End time: 12/07/2014 7:48 AM Staffing Anesthesiologist: Alvin Critchley Resident/CRNA: Rolla Plate Performed by: resident/CRNA  Preanesthetic Checklist Completed: patient identified, site marked, surgical consent, pre-op evaluation, timeout performed, IV checked, risks and benefits discussed and monitors and equipment checked Spinal Block Patient position: sitting Prep: Betadine and site prepped and draped Patient monitoring: heart rate, continuous pulse ox, blood pressure and cardiac monitor Approach: midline Location: L4-5 Injection technique: single-shot Needle Needle type: Whitacre and Introducer  Needle gauge: 25 G Needle length: 9 cm Additional Notes Negative paresthesia. Negative blood return. Positive free-flowing CSF. Expiration date of kit checked and confirmed. Patient tolerated procedure well, without complications.

## 2014-12-07 NOTE — Transfer of Care (Signed)
Immediate Anesthesia Transfer of Care Note  Patient: Marissa Rose  Procedure(s) Performed: Procedure(s) with comments: CESAREAN SECTION WITH BILATERAL TUBAL LIGATION (Right) - patient did not have left fallopian tube  Patient Location: L & D PACU  Anesthesia Type:Spinal  Level of Consciousness: sedated  Airway & Oxygen Therapy: Patient Spontanous Breathing and Patient connected to face mask oxygen  Post-op Assessment: Report given to RN and Post -op Vital signs reviewed and stable  Post vital signs: Reviewed and stable  Last Vitals:  Filed Vitals:   12/07/14 0923  BP: 104/67  Pulse: 89  Temp: 68.1 C    Complications: No apparent anesthesia complications

## 2014-12-07 NOTE — Anesthesia Preprocedure Evaluation (Signed)
Anesthesia Evaluation  Patient identified by MRN, date of birth, ID band Patient awake    Reviewed: Allergy & Precautions, NPO status , Patient's Chart, lab work & pertinent test results  Airway Mallampati: II  TM Distance: >3 FB Neck ROM: Full    Dental no notable dental hx.    Pulmonary former smoker,  breath sounds clear to auscultation  Pulmonary exam normal       Cardiovascular negative cardio ROS Normal cardiovascular exam    Neuro/Psych negative neurological ROS     GI/Hepatic negative GI ROS, Neg liver ROS,   Endo/Other  negative endocrine ROS  Renal/GU negative Renal ROS  negative genitourinary   Musculoskeletal negative musculoskeletal ROS (+)   Abdominal (+) + obese,  Abdomen: soft.    Peds negative pediatric ROS (+)  Hematology negative hematology ROS (+)   Anesthesia Other Findings   Reproductive/Obstetrics (+) Pregnancy                             Anesthesia Physical Anesthesia Plan  ASA: II  Anesthesia Plan: Spinal   Post-op Pain Management: MAC Combined w/ Regional for Post-op pain   Induction:   Airway Management Planned: Nasal Cannula  Additional Equipment:   Intra-op Plan:   Post-operative Plan:   Informed Consent: I have reviewed the patients History and Physical, chart, labs and discussed the procedure including the risks, benefits and alternatives for the proposed anesthesia with the patient or authorized representative who has indicated his/her understanding and acceptance.   Dental advisory given  Plan Discussed with: CRNA and Surgeon  Anesthesia Plan Comments:         Anesthesia Quick Evaluation

## 2014-12-07 NOTE — H&P (Signed)
History and Physical Interval Note:  Marissa Rose  has presented today for surgery, with the diagnosis of pregnancy  The various methods of treatment have been discussed with the patient and family. After consideration of risks, benefits and other options for treatment, the patient has consented to  Procedure(s): [place] (N/A) as a surgical intervention .  The patient's history has been reviewed, patient examined, no change in status, stable for surgery.  I have reviewed the patient's chart and labs.  Questions were answered to the patient's satisfaction.    She is not taking a beta blocker and one is not indicated.  Will Bonnet, MD, [place] 12/07/2014 7:27 AM

## 2014-12-08 ENCOUNTER — Other Ambulatory Visit: Payer: Self-pay

## 2014-12-08 LAB — CBC
HEMATOCRIT: 29.8 % — AB (ref 35.0–47.0)
HEMOGLOBIN: 10.5 g/dL — AB (ref 12.0–16.0)
MCH: 33.5 pg (ref 26.0–34.0)
MCHC: 35.1 g/dL (ref 32.0–36.0)
MCV: 95.5 fL (ref 80.0–100.0)
Platelets: 128 10*3/uL — ABNORMAL LOW (ref 150–440)
RBC: 3.12 MIL/uL — AB (ref 3.80–5.20)
RDW: 13.8 % (ref 11.5–14.5)
WBC: 9.1 10*3/uL (ref 3.6–11.0)

## 2014-12-08 LAB — SURGICAL PATHOLOGY

## 2014-12-08 LAB — TYPE AND SCREEN
ABO/RH(D): A NEG
ANTIBODY SCREEN: NEGATIVE

## 2014-12-08 MED ORDER — FERROUS SULFATE 325 (65 FE) MG PO TABS
325.0000 mg | ORAL_TABLET | Freq: Two times a day (BID) | ORAL | Status: DC
  Administered 2014-12-09 – 2014-12-10 (×2): 325 mg via ORAL
  Filled 2014-12-08 (×2): qty 1

## 2014-12-08 MED ORDER — ONDANSETRON 4 MG PO TBDP
4.0000 mg | ORAL_TABLET | Freq: Four times a day (QID) | ORAL | Status: DC | PRN
  Administered 2014-12-08: 4 mg via ORAL
  Filled 2014-12-08 (×2): qty 1

## 2014-12-08 MED ORDER — RHO D IMMUNE GLOBULIN 1500 UNIT/2ML IJ SOSY
300.0000 ug | PREFILLED_SYRINGE | Freq: Once | INTRAMUSCULAR | Status: AC
  Administered 2014-12-08: 300 ug via INTRAMUSCULAR
  Filled 2014-12-08: qty 2

## 2014-12-08 NOTE — Progress Notes (Signed)
Subjective: Postpartum Day 1: Cesarean Delivery with BTL  Patient reports that her pain and bleeding are well controlled. She has not gotten up to void yet. She is tolerating PO.   Objective: Vital signs in last 24 hours: Temp:  [97.8 F (36.6 C)-98.6 F (37 C)] 97.9 F (36.6 C) (07/06 0813) Pulse Rate:  [83-94] 85 (07/06 0813) Resp:  [18-20] 20 (07/06 0813) BP: (103-132)/(54-77) 110/63 mmHg (07/06 0813) SpO2:  [96 %-99 %] 98 % (07/06 0813)  Physical Exam:  General: alert, cooperative, appears stated age and no distress Lochia: appropriate Uterine Fundus: firm Incision: OR dressing in place  DVT Evaluation: No evidence of DVT seen on physical exam.   Recent Labs  12/07/14 0636 12/08/14 0545  HGB 12.3 10.5*  HCT 34.6* 29.8*    Assessment/Plan: Status post Cesarean section. Doing well postoperatively.  Continue current care. DC IVF today and change OR dressing. Breastfeeding   Louisa Second 12/08/2014, 10:41 AM

## 2014-12-08 NOTE — Anesthesia Postprocedure Evaluation (Signed)
  Anesthesia Post-op Note  Patient: Marissa Rose  Procedure(s) Performed: Procedure(s) with comments: CESAREAN SECTION WITH BILATERAL TUBAL LIGATION (Right) - patient did not have left fallopian tube  Anesthesia type:Spinal  Patient location: 340  Post pain: Pain level controlled  Post assessment: Post-op Vital signs reviewed, Patient's Cardiovascular Status Stable, Respiratory Function Stable, Patent Airway and No signs of Nausea or vomiting  Post vital signs: Reviewed and stable  Last Vitals:  Filed Vitals:   12/08/14 0813  BP: 110/63  Pulse: 85  Temp: 36.6 C  Resp: 20    Level of consciousness: awake, alert  and patient cooperative  Complications: No apparent anesthesia complications

## 2014-12-08 NOTE — Anesthesia Post-op Follow-up Note (Signed)
  Anesthesia Pain Follow-up Note  Patient: Marissa Rose  Day #: 1  Date of Follow-up: 12/08/2014 Time: 8:41 AM  Last Vitals:  Filed Vitals:   12/08/14 0813  BP: 110/63  Pulse: 85  Temp: 36.6 C  Resp: 20    Level of Consciousness: alert  Pain: none   Side Effects:None  Catheter Site Exam:clean  Plan: D/C from anesthesia care  Estill Batten

## 2014-12-09 LAB — RHOGAM INJECTION: UNIT DIVISION: 0

## 2014-12-09 LAB — FETAL SCREEN: Fetal Screen: NEGATIVE

## 2014-12-09 MED ORDER — FLEET ENEMA 7-19 GM/118ML RE ENEM: 1.0000 | ENEMA | Freq: Every day | RECTAL | Status: DC | PRN

## 2014-12-09 MED ORDER — BISACODYL 10 MG RE SUPP: 10.0000 mg | Freq: Every day | RECTAL | Status: DC | PRN

## 2014-12-09 NOTE — Progress Notes (Signed)
Subjective:  POD #2 from CS and right SO. Tired, not ready for discharge. Has not passed flatus yet. Tolerating regular diet. Voiding without difficulty. Breastfeeding. Had Rhogam yesterday   Objective:  Blood pressure 114/63, pulse 77, temperature 98.3 F (36.8 C), temperature source Oral, resp. rate 18, height 5\' 3"  (1.6 m), weight 98.431 kg (217 lb), SpO2 100 %, unknown if currently breastfeeding.  General: NAD Pulmonary: no increased work of breathing, CTA Abdomen: slightly tympanic, soft,   BS active x4 Incision: C+D+I, staples intact Extremities: no edema, no erythema, no tenderness  Results for orders placed or performed during the hospital encounter of 12/07/14 (from the past 72 hour(s))  CBC     Status: Abnormal   Collection Time: 12/07/14  6:36 AM  Result Value Ref Range   WBC 8.3 3.6 - 11.0 K/uL   RBC 3.62 (L) 3.80 - 5.20 MIL/uL   Hemoglobin 12.3 12.0 - 16.0 g/dL   HCT 34.6 (L) 35.0 - 47.0 %   MCV 95.5 80.0 - 100.0 fL   MCH 33.9 26.0 - 34.0 pg   MCHC 35.5 32.0 - 36.0 g/dL   RDW 13.7 11.5 - 14.5 %   Platelets 166 150 - 440 K/uL  Type and screen     Status: None   Collection Time: 12/07/14  6:37 AM  Result Value Ref Range   ABO/RH(D) A NEG    Antibody Screen NEG    Sample Expiration 12/10/2014   Surgical pathology     Status: None   Collection Time: 12/07/14  8:37 AM  Result Value Ref Range   SURGICAL PATHOLOGY      Surgical Pathology CASE: [id number] PATIENT: Osborne Casco Surgical Pathology Report     SPECIMEN SUBMITTED: A. Tube segment, right  CLINICAL HISTORY: None provided  PRE-OPERATIVE DIAGNOSIS: Pregnancy  POST-OPERATIVE DIAGNOSIS: None provided     DIAGNOSIS: A. RIGHT FALLOPIAN TUBE SEGMENT: - FALLOPIAN TUBE SEGMENT WITH FULL CROSS SECTION OF THE LUMEN.    GROSS DESCRIPTION:  A. The specimen is received in a formalin-filled container labeled with the patient's name and right tube segment.  Measurement: 1.5 x 0.9 x 0.6  cm Other findings: smooth pink serosa  Block summary: 1 - representative cross-sections   Final Diagnosis performed by Delorse Lek, MD.  Electronically signed 12/08/2014 12:47:21PM    The electronic signature indicates that the named Attending Pathologist has evaluated the specimen  Technical component performed at [facility], [address] [place], [place] [address] Lab: [phone] Dir: Darrick Penna. Evette Doffing, MD  Professional co mponent performed at [facility], [facility], [place], [place],  [address] Lab: [phone] Dir: Dellia Nims. Rubinas, MD    CBC     Status: Abnormal   Collection Time: 12/08/14  5:45 AM  Result Value Ref Range   WBC 9.1 3.6 - 11.0 K/uL   RBC 3.12 (L) 3.80 - 5.20 MIL/uL   Hemoglobin 10.5 (L) 12.0 - 16.0 g/dL   HCT 29.8 (L) 35.0 - 47.0 %   MCV 95.5 80.0 - 100.0 fL   MCH 33.5 26.0 - 34.0 pg   MCHC 35.1 32.0 - 36.0 g/dL   RDW 13.8 11.5 - 14.5 %   Platelets 128 (L) 150 - 440 K/uL  Rhogam injection     Status: None (Preliminary result)   Collection Time: 12/08/14  7:53 PM  Result Value Ref Range   Unit Number [id number]    Blood Component Type RHIG    Unit division 00    Status  of Unit ISSUED    Transfusion Status OK TO TRANSFUSE      Assessment:   39 y.o. [id number] postoperative day # 2 with slow return of bowel function   Plan:  1) Acute blood loss anemia - hemodynamically stable and asymptomatic - po ferrous sulfate  2) --/--/A NEG (07/05 4782) / Rubella Immune (12/04 0000) / Varicella Immune Had Rhogam yesterday  3) TDAP status   4) Breast/ Contraception-TL  5) Disposition-probable discharge tomorrow  6. Ambulate. Dulcolax suppository or Fleet's enema if needed.

## 2014-12-10 MED ORDER — IBUPROFEN 600 MG PO TABS: 600.0000 mg | ORAL_TABLET | Freq: Four times a day (QID) | ORAL | Status: DC | PRN

## 2014-12-10 MED ORDER — HYDROCODONE-ACETAMINOPHEN 5-325 MG PO TABS: 1.0000 | ORAL_TABLET | ORAL | Status: DC | PRN

## 2014-12-10 MED ORDER — FERROUS SULFATE 325 (65 FE) MG PO TABS: 325.0000 mg | ORAL_TABLET | Freq: Two times a day (BID) | ORAL | Status: DC

## 2014-12-10 MED ORDER — SENNOSIDES-DOCUSATE SODIUM 8.6-50 MG PO TABS: 2.0000 | ORAL_TABLET | ORAL | Status: DC

## 2014-12-10 NOTE — Discharge Summary (Signed)
Obstetrical Discharge Summary  Date of Admission: 12/07/2014 Date of Discharge: 12/10/2014  Primary OB: [facility]  Gestational Age at Delivery: [redacted]w[redacted]d  Antepartum complications: obesity Reason for Admission: schedule repeat cesarean delivery and tubal ligation Date of Delivery: 12/10/2014   Delivered By: Will Bonnet, MD Delivery Type: repeat cesarean section, low transverse incision Intrapartum complications/course: None Anesthesia: spinal Placenta: spontaneous Laceration: n/a Episiotomy: n/a Baby: Liveborn female, APGARs9/9, weight 4,210 g.   BP 115/66 mmHg  Pulse 75  Temp(Src) 98.2 F (36.8 C) (Oral)  Resp 20  SpO2 100%  GEN: NAD, comfortable, walking in room CV: RRR Pulm: CTABL, normal effort ABD: s/nd/nt, ff below umbilicus, Incision: c/intact/ slight serosanguinous drainage diffusely.  Staples Ext: 1+ edema bilaterally, no ttp   Post partum course: The patient was admitted for a scheduled repeat cesarean delivery and tubal ligation, which occurred without difficulty.  Uneventful, routine post partum course. Disposition: d/c home  Rh Immune globulin given: yes Rubella vaccine given: not applicable Tdap vaccine given in AP or PP setting: yes Flu vaccine given in AP or PP setting: no  Contraception: s/p tubal ligation (right tube only, no left fallopian tube identified intra-operatively)  Prenatal/Postnatal Panel: A NEG//Rubella Immune//RPR negative//HIV negative/HepB Surface Ag negative/  Plan:  Marissa Rose was discharged to home in good condition. Follow-up appointment with Prentice Docker, MD in 1 week    Discharge Medications:   Medication List    TAKE these medications        ferrous sulfate 325 (65 FE) MG tablet  Take 1 tablet (325 mg total) by mouth 2 (two) times daily with a meal.     folic acid 161 MCG tablet  Commonly known as:  FOLVITE  Take 400 mcg by mouth daily.     HYDROcodone-acetaminophen 5-325 MG per tablet  Commonly known  as:  NORCO/VICODIN  Take 1-2 tablets by mouth every 4 (four) hours as needed for moderate pain or severe pain.     ibuprofen 600 MG tablet  Commonly known as:  ADVIL,MOTRIN  Take 1 tablet (600 mg total) by mouth every 6 (six) hours as needed for mild pain.     prenatal multivitamin Tabs tablet  Take 1 tablet by mouth daily at 12 noon.     senna-docusate 8.6-50 MG per tablet  Commonly known as:  Senokot-S  Take 2 tablets by mouth daily.        Signed: ----- Larey Days, MD Attending Obstetrician and Gynecologist [facility]

## 2014-12-10 NOTE — Progress Notes (Signed)
Pt discharged at this time; going home with husband and new baby

## 2014-12-14 LAB — TYPE AND SCREEN
ABO/RH(D): A NEG
Antibody Screen: NEGATIVE

## 2016-10-03 ENCOUNTER — Ambulatory Visit
Admission: EM | Admit: 2016-10-03 | Discharge: 2016-10-03 | Disposition: A | Discharge status: Home / Self Care | Payer: No Typology Code available for payment source | Attending: Family Medicine | Admitting: Family Medicine

## 2016-10-03 ENCOUNTER — Encounter: Payer: Self-pay | Admitting: *Deleted

## 2016-10-03 DIAGNOSIS — R69 Illness, unspecified: Secondary | ICD-10-CM

## 2016-10-03 DIAGNOSIS — J029 Acute pharyngitis, unspecified: Secondary | ICD-10-CM

## 2016-10-03 DIAGNOSIS — R05 Cough: Secondary | ICD-10-CM | POA: Diagnosis not present

## 2016-10-03 DIAGNOSIS — J111 Influenza due to unidentified influenza virus with other respiratory manifestations: Secondary | ICD-10-CM

## 2016-10-03 DIAGNOSIS — R42 Dizziness and giddiness: Secondary | ICD-10-CM | POA: Diagnosis not present

## 2016-10-03 LAB — RAPID STREP SCREEN (MED CTR MEBANE ONLY): Streptococcus, Group A Screen (Direct): NEGATIVE

## 2016-10-03 MED ORDER — OSELTAMIVIR PHOSPHATE 75 MG PO CAPS: 75.0000 mg | ORAL_CAPSULE | Freq: Two times a day (BID) | ORAL | 0 refills | Status: DC

## 2016-10-03 MED ORDER — HYDROCOD POLST-CPM POLST ER 10-8 MG/5ML PO SUER: 5.0000 mL | Freq: Two times a day (BID) | ORAL | 0 refills | Status: DC | PRN

## 2016-10-03 NOTE — ED Triage Notes (Signed)
Patient started having symptoms of cough, sore throat, fever, left ear pain, and dizziness yesterday.

## 2016-10-03 NOTE — ED Provider Notes (Signed)
[facility]    CSN: [id number] Arrival date & time: 10/03/16  1358     History   Chief Complaint Chief Complaint  Patient presents with  . Sore Throat  . Fever  . Otalgia  . Dizziness  . Nausea    HPI Marissa Rose is a 41 y.o. female.    Sore Throat   Fever  Associated symptoms: congestion, cough, ear pain, myalgias, rhinorrhea and sore throat   Otalgia  Associated symptoms: congestion, cough, fever, rhinorrhea and sore throat   Dizziness  URI  Presenting symptoms: congestion, cough, ear pain, fatigue, fever, rhinorrhea and sore throat   Severity:  Moderate Onset quality:  Sudden Duration:  3 days Timing:  Constant Progression:  Worsening Chronicity:  New Relieved by:  Nothing Ineffective treatments:  OTC medications Associated symptoms: myalgias and swollen glands   Associated symptoms: no sinus pain and no wheezing   Risk factors: sick contacts (work at [employer])   Risk factors: not elderly, no chronic cardiac disease, no chronic kidney disease, no chronic respiratory disease, no diabetes mellitus, no immunosuppression, no recent illness and no recent travel     Past Medical History:  Diagnosis Date  . 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 High-risk pregnancy supervision 12/07/2014  . BMI 37.0-37.9, adult 12/07/2014  . S/P cesarean section 12/07/2014    Past Surgical History:  Procedure Laterality Date  . APPENDECTOMY    . CESAREAN SECTION     X 3  . CESAREAN SECTION WITH BILATERAL TUBAL LIGATION Right 12/07/2014   Procedure: CESAREAN SECTION WITH BILATERAL TUBAL LIGATION;  Surgeon: Will Bonnet, MD;  Location: [facility] ORS;  Service: Obstetrics;  Laterality: Right;  patient did not have left fallopian tube  . DILATION AND CURETTAGE OF UTERUS    . LEFT OOPHORECTOMY Left   . TONSILLECTOMY      OB History    Gravida Para Term Preterm AB Living   6 4 3  0 2 4   SAB TAB Ectopic  Multiple Live Births   1 0 1 0 4       Home Medications    Prior to Admission medications   Medication Sig Start Date End Date Taking? Authorizing Provider  ibuprofen (ADVIL,MOTRIN) 600 MG tablet Take 1 tablet (600 mg total) by mouth every 6 (six) hours as needed for mild pain. 12/10/14  Yes Chelsea C Ward, MD  chlorpheniramine-HYDROcodone [facility] PENNKINETIC ER) 10-8 MG/5ML SUER Take 5 mLs by mouth every 12 (twelve) hours as needed. 10/03/16   Norval Gable, MD  ferrous sulfate 325 (65 FE) MG tablet Take 1 tablet (325 mg total) by mouth 2 (two) times daily with a meal. 12/10/14   Chelsea C Ward, MD  folic acid (FOLVITE) 193 MCG tablet Take 400 mcg by mouth daily.    Historical Provider, MD  HYDROcodone-acetaminophen (NORCO/VICODIN) 5-325 MG per tablet Take 1-2 tablets by mouth every 4 (four) hours as needed for moderate pain or severe pain. 12/10/14   Chelsea C Ward, MD  oseltamivir (TAMIFLU) 75 MG capsule Take 1 capsule (75 mg total) by mouth 2 (two) times daily. 10/03/16   Norval Gable, MD  Prenatal Vit-Fe Fumarate-FA (PRENATAL MULTIVITAMIN) TABS tablet Take 1 tablet by mouth daily at 12 noon.    Historical Provider, MD  senna-docusate (SENOKOT-S) 8.6-50 MG per tablet Take 2 tablets by mouth daily. 12/10/14   Honor Loh Ward, MD  Family History Family History  Problem Relation Age of Onset  . Hypertension Mother   . Diabetes Father     Social History Social History  Substance Use Topics  . Smoking status: Former Smoker    Packs/day: 0.50    Types: Cigarettes    Quit date: 06/05/2003  . Smokeless tobacco: Never Used  . Alcohol use No     Allergies   Sulfa antibiotics   Review of Systems Review of Systems  Constitutional: Positive for fatigue and fever.  HENT: Positive for congestion, ear pain, rhinorrhea and sore throat. Negative for sinus pain.   Respiratory: Positive for cough. Negative for wheezing.   Musculoskeletal: Positive for myalgias.  Neurological: Positive for  dizziness.     Physical Exam Triage Vital Signs ED Triage Vitals  Enc Vitals Group     BP 10/03/16 1419 121/72     Pulse Rate 10/03/16 1419 (!) 101     Resp 10/03/16 1419 16     Temp 10/03/16 1419 99.9 F (37.7 C)     Temp Source 10/03/16 1419 Oral     SpO2 10/03/16 1419 98 %     Weight 10/03/16 1419 185 lb (83.9 kg)     Height 10/03/16 1419 5\' 3"  (1.6 m)     Head Circumference --      Peak Flow --      Pain Score 10/03/16 1422 7     Pain Loc --      Pain Edu? --      Excl. in [place]? --    No data found.   Updated Vital Signs BP 121/72 (BP Location: Left Arm)   Pulse (!) 101   Temp 99.9 F (37.7 C) (Oral)   Resp 16   Ht 5\' 3"  (1.6 m)   Wt 185 lb (83.9 kg)   LMP 09/19/2016   SpO2 98%   BMI 32.77 kg/m   Visual Acuity Right Eye Distance:   Left Eye Distance:   Bilateral Distance:    Right Eye Near:   Left Eye Near:    Bilateral Near:     Physical Exam  Constitutional: She appears well-developed and well-nourished. No distress.  HENT:  Head: Normocephalic and atraumatic.  Right Ear: Tympanic membrane, external ear and ear canal normal.  Left Ear: Tympanic membrane, external ear and ear canal normal.  Nose: Mucosal edema and rhinorrhea present. No nose lacerations, sinus tenderness, nasal deformity, septal deviation or nasal septal hematoma. No epistaxis.  No foreign bodies. Right sinus exhibits no maxillary sinus tenderness and no frontal sinus tenderness. Left sinus exhibits no maxillary sinus tenderness and no frontal sinus tenderness.  Mouth/Throat: Uvula is midline, oropharynx is clear and moist and mucous membranes are normal. No oropharyngeal exudate.  Eyes: Conjunctivae and EOM are normal. Pupils are equal, round, and reactive to light. Right eye exhibits no discharge. Left eye exhibits no discharge. No scleral icterus.  Neck: Normal range of motion. Neck supple. No thyromegaly present.  Cardiovascular: Normal rate, regular rhythm and normal heart sounds.     Pulmonary/Chest: Effort normal and breath sounds normal. No respiratory distress. She has no wheezes. She has no rales.  Lymphadenopathy:    She has no cervical adenopathy.  Skin: She is not diaphoretic.  Nursing note and vitals reviewed.    UC Treatments / Results  Labs (all labs ordered are listed, but only abnormal results are displayed) Labs Reviewed  RAPID STREP SCREEN (NOT AT [facility])  CULTURE, GROUP A STREP [facility])  EKG  EKG Interpretation None       Radiology No results found.  Procedures Procedures (including critical care time)  Medications Ordered in UC Medications - No data to display   Initial Impression / Assessment and Plan / UC Course  I have reviewed the triage vital signs and the nursing notes.  Pertinent labs & imaging results that were available during my care of the patient were reviewed by me and considered in my medical decision making (see chart for details).       Final Clinical Impressions(s) / UC Diagnoses   Final diagnoses:  Influenza-like illness    New Prescriptions Discharge Medication List as of 10/03/2016  2:57 PM    START taking these medications   Details  chlorpheniramine-HYDROcodone (TUSSIONEX PENNKINETIC ER) 10-8 MG/5ML SUER Take 5 mLs by mouth every 12 (twelve) hours as needed., Starting Wed 10/03/2016, Normal    oseltamivir (TAMIFLU) 75 MG capsule Take 1 capsule (75 mg total) by mouth 2 (two) times daily., Starting Wed 10/03/2016, Normal       1. Lab results and diagnosis reviewed with patient 2. rx as per orders above; reviewed possible side effects, interactions, risks and benefits  3. Recommend supportive treatment with increased fluids, otc analgesics prn, rest 4. Follow-up prn if symptoms worsen or don't improve   Norval Gable, MD 10/03/16 1526

## 2016-10-04 ENCOUNTER — Telehealth (HOSPITAL_COMMUNITY): Payer: Self-pay | Admitting: Internal Medicine

## 2016-10-04 LAB — CULTURE, GROUP A STREP (THRC)

## 2016-10-04 MED ORDER — PENICILLIN V POTASSIUM 500 MG PO TABS: 500.0000 mg | ORAL_TABLET | Freq: Two times a day (BID) | ORAL | 0 refills | Status: DC

## 2016-10-04 NOTE — Telephone Encounter (Signed)
Clinical staff, please let patient know that throat culture was positive for moderate group A Strep.  Will send rx for penicillin V to pharmacy of record, [facility] in [place].  Recheck or followup with PCP for further evaluation if symptoms are not improving.  LM

## 2016-12-16 ENCOUNTER — Ambulatory Visit
Admission: EM | Admit: 2016-12-16 | Discharge: 2016-12-16 | Disposition: A | Discharge status: Home / Self Care | Payer: PRIVATE HEALTH INSURANCE | Attending: Family Medicine | Admitting: Family Medicine

## 2016-12-16 ENCOUNTER — Encounter: Payer: Self-pay | Admitting: Gynecology

## 2016-12-16 DIAGNOSIS — M5432 Sciatica, left side: Secondary | ICD-10-CM | POA: Diagnosis not present

## 2016-12-16 MED ORDER — PREDNISONE 20 MG PO TABS: ORAL_TABLET | ORAL | 0 refills | Status: DC

## 2016-12-16 MED ORDER — CYCLOBENZAPRINE HCL 10 MG PO TABS: 10.0000 mg | ORAL_TABLET | Freq: Three times a day (TID) | ORAL | 0 refills | Status: DC | PRN

## 2016-12-16 MED ORDER — HYDROCODONE-ACETAMINOPHEN 5-325 MG PO TABS: ORAL_TABLET | ORAL | 0 refills | Status: DC

## 2016-12-16 NOTE — ED Provider Notes (Signed)
[facility]    CSN: [id number] Arrival date & time: 12/16/16  9476     History   Chief Complaint Chief Complaint  Patient presents with  . Back Pain  . Hip Pain    HPI Marissa Rose is a 41 y.o. female.   The history is provided by the patient.  Back Pain  Location:  Lumbar spine Quality:  Aching Radiates to:  L posterior upper leg Pain severity:  Moderate Pain is:  Same all the time Onset quality:  Sudden Duration:  4 days Timing:  Constant Progression:  Unchanged Chronicity:  New Context: not recent injury   Context comment:  Spontaneous; no known injury Relieved by:  Nothing Worsened by:  Movement, twisting and bending Ineffective treatments:  OTC medications Associated symptoms: no abdominal pain, no abdominal swelling, no bladder incontinence, no bowel incontinence, no chest pain, no dysuria, no fever, no headaches, no leg pain, no numbness, no paresthesias, no pelvic pain, no perianal numbness, no tingling and no weakness   Risk factors: no hx of cancer, no hx of osteoporosis, no lack of exercise, no menopause, not obese, not pregnant, no recent surgery and no steroid use     Past Medical History:  Diagnosis Date  . 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 High-risk pregnancy supervision 12/07/2014  . BMI 37.0-37.9, adult 12/07/2014  . S/P cesarean section 12/07/2014    Past Surgical History:  Procedure Laterality Date  . APPENDECTOMY    . CESAREAN SECTION     X 3  . CESAREAN SECTION WITH BILATERAL TUBAL LIGATION Right 12/07/2014   Procedure: CESAREAN SECTION WITH BILATERAL TUBAL LIGATION;  Surgeon: Will Bonnet, MD;  Location: [facility] ORS;  Service: Obstetrics;  Laterality: Right;  patient did not have left fallopian tube  . DILATION AND CURETTAGE OF UTERUS    . LEFT OOPHORECTOMY Left   . TONSILLECTOMY      OB History    Gravida Para Term Preterm AB Living   6 4 3  0 2 4   SAB TAB Ectopic  Multiple Live Births   1 0 1 0 4       Home Medications    Prior to Admission medications   Medication Sig Start Date End Date Taking? Authorizing Provider  ibuprofen (ADVIL,MOTRIN) 600 MG tablet Take 1 tablet (600 mg total) by mouth every 6 (six) hours as needed for mild pain. 12/10/14  Yes Ward, Honor Loh, MD  chlorpheniramine-HYDROcodone (TUSSIONEX PENNKINETIC ER) 10-8 MG/5ML SUER Take 5 mLs by mouth every 12 (twelve) hours as needed. 10/03/16   Norval Gable, MD  cyclobenzaprine (FLEXERIL) 10 MG tablet Take 1 tablet (10 mg total) by mouth 3 (three) times daily as needed for muscle spasms. 12/16/16   Norval Gable, MD  ferrous sulfate 325 (65 FE) MG tablet Take 1 tablet (325 mg total) by mouth 2 (two) times daily with a meal. 12/10/14   Ward, Honor Loh, MD  folic acid (FOLVITE) 546 MCG tablet Take 400 mcg by mouth daily.    [provider]  HYDROcodone-acetaminophen (NORCO/VICODIN) 5-325 MG tablet 1-2 tabs po q 8 hours prn 12/16/16   Norval Gable, MD  oseltamivir (TAMIFLU) 75 MG capsule Take 1 capsule (75 mg total) by mouth 2 (two) times daily. 10/03/16   Norval Gable, MD  penicillin v potassium (VEETID) 500 MG tablet Take 1 tablet (500 mg total) by mouth 2 (two) times daily.  10/04/16   Sherlene Shams, MD  predniSONE (DELTASONE) 20 MG tablet 2 tabs po qd for 5 days 12/16/16   Norval Gable, MD  Prenatal Vit-Fe Fumarate-FA (PRENATAL MULTIVITAMIN) TABS tablet Take 1 tablet by mouth daily at 12 noon.    [provider]  senna-docusate (SENOKOT-S) 8.6-50 MG per tablet Take 2 tablets by mouth daily. 12/10/14   Ward, Honor Loh, MD    Family History Family History  Problem Relation Age of Onset  . Hypertension Mother   . Diabetes Father     Social History Social History  Substance Use Topics  . Smoking status: Former Smoker    Packs/day: 0.50    Types: Cigarettes    Quit date: 06/05/2003  . Smokeless tobacco: Never Used  . Alcohol use No     Allergies   Sulfa  antibiotics   Review of Systems Review of Systems  Constitutional: Negative for fever.  Cardiovascular: Negative for chest pain.  Gastrointestinal: Negative for abdominal pain and bowel incontinence.  Genitourinary: Negative for bladder incontinence, dysuria and pelvic pain.  Musculoskeletal: Positive for back pain.  Neurological: Negative for tingling, weakness, numbness, headaches and paresthesias.     Physical Exam Triage Vital Signs ED Triage Vitals  Enc Vitals Group     BP 12/16/16 0947 119/76     Pulse Rate 12/16/16 0947 76     Resp 12/16/16 0947 16     Temp 12/16/16 0947 98.5 F (36.9 C)     Temp Source 12/16/16 0947 Oral     SpO2 12/16/16 0947 100 %     Weight 12/16/16 0944 190 lb (86.2 kg)     Height 12/16/16 0944 5\' 3"  (1.6 m)     Head Circumference --      Peak Flow --      Pain Score 12/16/16 0945 6     Pain Loc --      Pain Edu? --      Excl. in [place]? --    No data found.   Updated Vital Signs BP 119/76 (BP Location: Left Arm)   Pulse 76   Temp 98.5 F (36.9 C) (Oral)   Resp 16   Ht 5\' 3"  (1.6 m)   Wt 190 lb (86.2 kg)   LMP 12/02/2016   SpO2 100%   BMI 33.66 kg/m   Visual Acuity Right Eye Distance:   Left Eye Distance:   Bilateral Distance:    Right Eye Near:   Left Eye Near:    Bilateral Near:     Physical Exam  Constitutional: She appears well-developed and well-nourished. No distress.  Musculoskeletal:       Lumbar back: She exhibits tenderness (over left lumbar sacral paraspinous muscles and left upper buttock area) and spasm. She exhibits normal range of motion, no bony tenderness, no swelling, no edema, no deformity, no laceration, no pain and normal pulse.  Skin: She is not diaphoretic.  Nursing note and vitals reviewed.    UC Treatments / Results  Labs (all labs ordered are listed, but only abnormal results are displayed) Labs Reviewed - No data to display  EKG  EKG Interpretation None       Radiology No results  found.  Procedures Procedures (including critical care time)  Medications Ordered in UC Medications - No data to display   Initial Impression / Assessment and Plan / UC Course  I have reviewed the triage vital signs and the nursing notes.  Pertinent labs & imaging results that  were available during my care of the patient were reviewed by me and considered in my medical decision making (see chart for details).       Final Clinical Impressions(s) / UC Diagnoses   Final diagnoses:  Sciatica of left side    New Prescriptions Discharge Medication List as of 12/16/2016 10:14 AM    START taking these medications   Details  cyclobenzaprine (FLEXERIL) 10 MG tablet Take 1 tablet (10 mg total) by mouth 3 (three) times daily as needed for muscle spasms., Starting Sun 12/16/2016, Normal    predniSONE (DELTASONE) 20 MG tablet 2 tabs po qd for 5 days, Normal       1. diagnosis reviewed with patient 2. rx as per orders above; reviewed possible side effects, interactions, risks and benefits  3. Recommend supportive treatment with otc analgesics 4. Follow-up prn if symptoms worsen or don't improve   Norval Gable, MD 12/16/16 1322

## 2016-12-16 NOTE — ED Triage Notes (Signed)
Per patient constant pain in left lower back and left hip. Per patient pain started out couple months ago with her left hip and x 4 days ago left lower back and hip pain. Patient denies any injury to back and hip.

## 2017-03-05 ENCOUNTER — Ambulatory Visit
Admission: EM | Admit: 2017-03-05 | Discharge: 2017-03-05 | Disposition: A | Discharge status: Home / Self Care | Payer: PRIVATE HEALTH INSURANCE | Attending: Family Medicine | Admitting: Family Medicine

## 2017-03-05 DIAGNOSIS — R21 Rash and other nonspecific skin eruption: Secondary | ICD-10-CM

## 2017-03-05 MED ORDER — CLOBETASOL PROPIONATE 0.05 % EX SHAM: MEDICATED_SHAMPOO | CUTANEOUS | 0 refills | Status: DC

## 2017-03-05 NOTE — ED Triage Notes (Signed)
Patient complains of rash in scalp and back of neck. Patient states that she has taken benadryl without relief. Patient states that she noticed the rash yesterday with itchiness. Reports that she got her hair colored on Saturday morning and started using a new shampoo yesterday morning.

## 2017-03-05 NOTE — ED Provider Notes (Signed)
[facility]    CSN: [id number] Arrival date & time: 03/05/17  0911     History   Chief Complaint Chief Complaint  Patient presents with  . Rash   HPI 41 year old female presents with complaints of rash.  Patient states that she got her hair colored on Saturday. Sunday she noted a rash at the back of her neck/hairline. She states that she also started a new shampoo recently. Area is very itchy. She reports that it has been raised at times. She has taken Benadryl without relief. No known relieving factors. No known exacerbating factors. No other associated symptoms. No other complaints or concerns at this time.  Past Medical History:  Diagnosis Date  . 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 BMI 37.0-37.9, adult 12/07/2014  . S/P cesarean section 12/07/2014    Past Surgical History:  Procedure Laterality Date  . APPENDECTOMY    . CESAREAN SECTION     X 3  . CESAREAN SECTION WITH BILATERAL TUBAL LIGATION Right 12/07/2014   Procedure: CESAREAN SECTION WITH BILATERAL TUBAL LIGATION;  Surgeon: Will Bonnet, MD;  Location: [facility] ORS;  Service: Obstetrics;  Laterality: Right;  patient did not have left fallopian tube  . DILATION AND CURETTAGE OF UTERUS    . LEFT OOPHORECTOMY Left   . TONSILLECTOMY      OB History    Gravida Para Term Preterm AB Living   6 4 3  0 2 4   SAB TAB Ectopic Multiple Live Births   1 0 1 0 4       Home Medications    Prior to Admission medications   Medication Sig Start Date End Date Taking? Authorizing Provider  ibuprofen (ADVIL,MOTRIN) 600 MG tablet Take 1 tablet (600 mg total) by mouth every 6 (six) hours as needed for mild pain. 12/10/14  Yes Ward, Honor Loh, MD  Clobetasol Propionate 0.05 % shampoo Daily as needed. Do not use for more than 1 week consecutively. 03/05/17   Coral Spikes, DO  predniSONE (DELTASONE) 20 MG tablet 2 tabs po qd for 5 days 12/16/16   Norval Gable, MD    Prenatal Vit-Fe Fumarate-FA (PRENATAL MULTIVITAMIN) TABS tablet Take 1 tablet by mouth daily at 12 noon.    [provider]  senna-docusate (SENOKOT-S) 8.6-50 MG per tablet Take 2 tablets by mouth daily. 12/10/14   Ward, Honor Loh, MD    Family History Family History  Problem Relation Age of Onset  . Hypertension Mother   . Diabetes Father     Social History Social History  Substance Use Topics  . Smoking status: Former Smoker    Packs/day: 0.50    Types: Cigarettes    Quit date: 06/05/2003  . Smokeless tobacco: Never Used  . Alcohol use No     Allergies   Sulfa antibiotics   Review of Systems Review of Systems  Constitutional: Negative.   Skin: Positive for rash.       Itching.   Physical Exam Triage Vital Signs ED Triage Vitals  Enc Vitals Group     BP 03/05/17 0923 118/76     Pulse Rate 03/05/17 0923 69     Resp 03/05/17 0923 18     Temp 03/05/17 0923 98.3 F (36.8 C)     Temp Source 03/05/17 0923 Oral     SpO2 03/05/17 0923 99 %     Weight 03/05/17 0921 185  lb (83.9 kg)     Height 03/05/17 0921 5\' 3"  (1.6 m)     Head Circumference --      Peak Flow --      Pain Score 03/05/17 0921 0     Pain Loc --      Pain Edu? --      Excl. in [place]? --    Updated Vital Signs BP 118/76 (BP Location: Left Arm)   Pulse 69   Temp 98.3 F (36.8 C) (Oral)   Resp 18   Ht 5\' 3"  (1.6 m)   Wt 185 lb (83.9 kg)   LMP 02/19/2017   SpO2 99%   Breastfeeding? No   BMI 32.77 kg/m   Physical Exam  Constitutional: She is oriented to person, place, and time. She appears well-developed. No distress.  Cardiovascular: Normal rate and regular rhythm.   Pulmonary/Chest: Effort normal and breath sounds normal.  Neurological: She is alert and oriented to person, place, and time.  Skin:  Area of erythema and excoriation noted at the posterior scalp/hair line.  Psychiatric: She has a normal mood and affect.  Vitals reviewed.  UC Treatments / Results  Labs (all labs  ordered are listed, but only abnormal results are displayed) Labs Reviewed - No data to display  EKG  EKG Interpretation None       Radiology No results found.  Procedures Procedures (including critical care time)  Medications Ordered in UC Medications - No data to display   Initial Impression / Assessment and Plan / UC Course  I have reviewed the triage vital signs and the nursing notes.  Pertinent labs & imaging results that were available during my care of the patient were reviewed by me and considered in my medical decision making (see chart for details).    41 year old female presents with rash. Likely contact dermatitis. Treating with clobetasol.  Final Clinical Impressions(s) / UC Diagnoses   Final diagnoses:  Rash    New Prescriptions New Prescriptions   CLOBETASOL PROPIONATE 0.05 % SHAMPOO    Daily as needed. Do not use for more than 1 week consecutively.   Controlled Substance Prescriptions [place] Controlled Substance Registry consulted? Not Applicable   Coral Spikes, [place] 03/05/17 1937

## 2017-03-05 NOTE — Discharge Instructions (Signed)
Use the shampoo for the next few days then stop.  Take care  Dr. Lacinda Axon

## 2017-05-23 ENCOUNTER — Ambulatory Visit (INDEPENDENT_AMBULATORY_CARE_PROVIDER_SITE_OTHER): Payer: PRIVATE HEALTH INSURANCE | Admitting: Obstetrics and Gynecology

## 2017-05-23 ENCOUNTER — Encounter: Payer: Self-pay | Admitting: Obstetrics and Gynecology

## 2017-05-23 VITALS — BP 120/79 | HR 82 | Ht 63.0 in | Wt 203.5 lb

## 2017-05-23 DIAGNOSIS — E669 Obesity, unspecified: Secondary | ICD-10-CM | POA: Diagnosis not present

## 2017-05-23 MED ORDER — CYANOCOBALAMIN 1000 MCG/ML IJ SOLN: 1000.0000 ug | INTRAMUSCULAR | 1 refills | Status: DC

## 2017-05-23 MED ORDER — PHENTERMINE HCL 37.5 MG PO TABS: 37.5000 mg | ORAL_TABLET | Freq: Every day | ORAL | 2 refills | Status: DC

## 2017-05-23 NOTE — Patient Instructions (Signed)

## 2017-05-23 NOTE — Progress Notes (Signed)
Subjective:  Marissa Rose is a 41 y.o. [id number] at Unknown being seen today for weight loss management- initial visit.  Patient reports General ROS: negative and reports previous weight loss attempts:   Onset was gradual over last year(s) with pregnancies.   Previous/Current treatment includes: small frequent feedings, vitamin B-12 injections and appetite stimulant.   Pertinent medical history includes: chronic digestive disease, diabetes, eating disorder, anxiety and psychiatric illness.  Risk factors include: social isolation, poverty, alcoholism, illicit drug use, excessive exercise, poor dentition and new medication.  The patient has a surgical history of: tubal and oopherectomy.  The following portions of the patient's history were reviewed and updated as appropriate: allergies, current medications, past family history, past medical history, past social history, past surgical history and problem list.   Objective:   Vitals:   05/23/17 1410  BP: 120/79  Pulse: 82  Weight: 203 lb 8 oz (92.3 kg)  Height: 5\' 3"  (1.6 m)    General:  Alert, oriented and cooperative. Patient is in no acute distress.  :   :   :   :   :   :   PE: Well groomed female in no current distress,   Mental Status: Normal mood and affect. Normal behavior. Normal judgment and thought content.   Current BMI: Body mass index is 36.05 kg/m.   Assessment and Plan:  Obesity  There are no diagnoses linked to this encounter.  Plan: low carb, High protein diet RX for adipex 37.5 mg daily and B12 1063mcg.ml monthly, to start now with first injection given at today's visit. Reviewed side-effects common to both medications and expected outcomes. Increase daily water intake to at least 8 bottle a day, every day.  Goal is to reduse weight by 10% by end of three months, and will re-evaluate then.  RTC in 4 weeks for Nurse visit to check weight & BP, and get next B12 injections.    Please refer to After Visit  Summary for other counseling recommendations.    [place], Ryo Klang N, CNM        Jamaury Gumz Cos [place], CNM      Consider the Low Glycemic Index Diet and 6 smaller meals daily .  This boosts your metabolism and regulates your sugars:   Use the protein bar by [employer] because they have lots of fiber in them  Find the low carb flatbreads, tortillas and pita breads for sandwiches:  Joseph's makes a pita bread and a flat bread , available at [facility] and [employer]; [employer] makes a low carb flatbread available at [employer] and HT that is 9 net carbs and 100 cal [place] makes a low carb whole wheat tortilla available at [employer] most grocery stores with 6 net carbs and 210 cal  [place] yogurt can still have a lot of carbs .  Dannon Light N fit has 80 cal and 8 carbs

## 2017-06-24 ENCOUNTER — Ambulatory Visit (INDEPENDENT_AMBULATORY_CARE_PROVIDER_SITE_OTHER): Payer: PRIVATE HEALTH INSURANCE | Admitting: Certified Nurse Midwife

## 2017-06-24 VITALS — BP 132/88 | HR 73 | Wt 194.6 lb

## 2017-06-24 DIAGNOSIS — Z0130 Encounter for examination of blood pressure without abnormal findings: Secondary | ICD-10-CM

## 2017-06-24 DIAGNOSIS — E663 Overweight: Secondary | ICD-10-CM | POA: Diagnosis not present

## 2017-06-24 MED ORDER — CYANOCOBALAMIN 1000 MCG/ML IJ SOLN
1000.0000 ug | Freq: Once | INTRAMUSCULAR | Status: AC
  Administered 2017-06-24: 1000 ug via INTRAMUSCULAR

## 2017-06-24 NOTE — Progress Notes (Signed)
Pt is here for wt, bp check, b-12 inj She is doing well, denies any s/e  06/24/17 wt- 194.6lb 05/23/17 wt- 203lb

## 2017-06-24 NOTE — Progress Notes (Signed)
I have reviewed the record and concur with patient management and plan of care.    Jenkins Michelle Lawhorn, CNM [facility] 

## 2017-07-22 ENCOUNTER — Ambulatory Visit (INDEPENDENT_AMBULATORY_CARE_PROVIDER_SITE_OTHER): Payer: PRIVATE HEALTH INSURANCE | Admitting: Certified Nurse Midwife

## 2017-07-22 VITALS — BP 122/75 | HR 75 | Wt 192.0 lb

## 2017-07-22 DIAGNOSIS — Z0130 Encounter for examination of blood pressure without abnormal findings: Secondary | ICD-10-CM

## 2017-07-22 MED ORDER — CYANOCOBALAMIN 1000 MCG/ML IJ SOLN
1000.0000 ug | Freq: Once | INTRAMUSCULAR | Status: AC
  Administered 2017-07-22: 1000 ug via INTRAMUSCULAR

## 2017-07-23 NOTE — Progress Notes (Signed)
Pt present for nurse visit for injection and BP check.  Marissa Rose, CNM

## 2017-08-08 ENCOUNTER — Encounter: Payer: Self-pay | Admitting: Obstetrics and Gynecology

## 2017-08-19 ENCOUNTER — Ambulatory Visit: Payer: PRIVATE HEALTH INSURANCE

## 2017-08-26 ENCOUNTER — Ambulatory Visit: Payer: PRIVATE HEALTH INSURANCE

## 2017-09-02 ENCOUNTER — Ambulatory Visit (INDEPENDENT_AMBULATORY_CARE_PROVIDER_SITE_OTHER): Payer: PRIVATE HEALTH INSURANCE | Admitting: Obstetrics and Gynecology

## 2017-09-02 VITALS — BP 130/80 | HR 78 | Ht 63.0 in | Wt 190.0 lb

## 2017-09-02 DIAGNOSIS — E669 Obesity, unspecified: Secondary | ICD-10-CM

## 2017-09-02 MED ORDER — CYANOCOBALAMIN 1000 MCG/ML IJ SOLN
1000.0000 ug | Freq: Once | INTRAMUSCULAR | Status: AC
  Administered 2017-09-02: 1000 ug via INTRAMUSCULAR

## 2017-09-02 NOTE — Progress Notes (Signed)
B-12 injection no side effect from b-12 injections  BP 130/80   Pulse 78   Ht 5\' 3"  (1.6 m)   Wt 190 lb (86.2 kg)   LMP 08/31/2017   BMI 33.66 kg/m

## 2017-09-23 ENCOUNTER — Ambulatory Visit (INDEPENDENT_AMBULATORY_CARE_PROVIDER_SITE_OTHER): Payer: PRIVATE HEALTH INSURANCE | Admitting: Obstetrics and Gynecology

## 2017-09-23 ENCOUNTER — Encounter: Payer: Self-pay | Admitting: Obstetrics and Gynecology

## 2017-09-23 VITALS — BP 124/86 | HR 78 | Ht 63.0 in | Wt 193.5 lb

## 2017-09-23 DIAGNOSIS — E669 Obesity, unspecified: Secondary | ICD-10-CM

## 2017-09-23 MED ORDER — CYANOCOBALAMIN 1000 MCG/ML IJ SOLN: 1000.0000 ug | INTRAMUSCULAR | 1 refills | Status: DC

## 2017-09-23 NOTE — Progress Notes (Signed)
SUBJECTIVE:  42 y.o. here for follow-up weight loss visit, previously seen 8 weeks ago. Denies any concerns and feels like medication has worked well, but has to take it so early (works in [place]) that it wears off by the time she leaves work. Has been off medication for 3 weeks.  OBJECTIVE:  BP 124/86   Pulse 78   Ht 5\' 3"  (1.6 m)   Wt 193 lb 8 oz (87.8 kg)   LMP 09/09/2017   BMI 34.28 kg/m   Body mass index is 34.28 kg/m. Patient appears well.  ASSESSMENT:  Obesity- responding well to weight loss plan  PLAN:  To continue with current medications increasing phentermine to 1 tablet in am and 1/2 tablet at noon.. B12 1045mcg/ml injection given  RTC in 4 weeks as planned  Melody [place], CNM

## 2017-10-21 ENCOUNTER — Ambulatory Visit: Payer: PRIVATE HEALTH INSURANCE | Admitting: Obstetrics and Gynecology

## 2017-10-29 ENCOUNTER — Encounter: Payer: Self-pay | Admitting: Obstetrics and Gynecology

## 2017-11-04 ENCOUNTER — Ambulatory Visit (INDEPENDENT_AMBULATORY_CARE_PROVIDER_SITE_OTHER): Payer: PRIVATE HEALTH INSURANCE | Admitting: Obstetrics and Gynecology

## 2017-11-04 ENCOUNTER — Encounter: Payer: Self-pay | Admitting: Obstetrics and Gynecology

## 2017-11-04 VITALS — BP 120/84 | HR 80 | Wt 188.2 lb

## 2017-11-04 DIAGNOSIS — E669 Obesity, unspecified: Secondary | ICD-10-CM

## 2017-11-04 MED ORDER — CYANOCOBALAMIN 1000 MCG/ML IJ SOLN
1000.0000 ug | Freq: Once | INTRAMUSCULAR | Status: AC
  Administered 2017-11-04: 1000 ug via INTRAMUSCULAR

## 2017-11-04 NOTE — Progress Notes (Signed)
Pt is here for wt, bp check,b12 inj  She is doing very well, denies any s/e  11/04/17 wt- 188.2lb 09/23/17 wt- 193lb

## 2017-12-02 ENCOUNTER — Ambulatory Visit: Payer: PRIVATE HEALTH INSURANCE | Admitting: Obstetrics and Gynecology

## 2017-12-06 ENCOUNTER — Encounter: Payer: Self-pay | Admitting: Obstetrics and Gynecology

## 2017-12-16 ENCOUNTER — Ambulatory Visit (INDEPENDENT_AMBULATORY_CARE_PROVIDER_SITE_OTHER): Payer: PRIVATE HEALTH INSURANCE | Admitting: Obstetrics and Gynecology

## 2017-12-16 ENCOUNTER — Encounter: Payer: Self-pay | Admitting: Obstetrics and Gynecology

## 2017-12-16 VITALS — BP 117/72 | HR 86 | Ht 63.0 in | Wt 187.4 lb

## 2017-12-16 DIAGNOSIS — E669 Obesity, unspecified: Secondary | ICD-10-CM

## 2017-12-16 MED ORDER — CYANOCOBALAMIN 1000 MCG/ML IJ SOLN
1000.0000 ug | Freq: Once | INTRAMUSCULAR | Status: AC
  Administered 2017-12-16: 1000 ug via INTRAMUSCULAR

## 2017-12-16 NOTE — Progress Notes (Signed)
Pt is here for wt, bp check, b-12 inj She is doing well, having some problems with constipation, we discussed apple juice, prune juice, stool softner  12/16/17 wt- 187.4lb 11/04/17 wt- 188lb

## 2018-01-06 ENCOUNTER — Encounter: Payer: Self-pay | Admitting: Obstetrics and Gynecology

## 2018-01-13 ENCOUNTER — Other Ambulatory Visit: Payer: Self-pay | Admitting: Obstetrics and Gynecology

## 2018-01-13 DIAGNOSIS — Z1231 Encounter for screening mammogram for malignant neoplasm of breast: Secondary | ICD-10-CM

## 2018-01-27 ENCOUNTER — Inpatient Hospital Stay: Admission: RE | Admit: 2018-01-27 | Payer: PRIVATE HEALTH INSURANCE | Source: Ambulatory Visit

## 2018-01-27 ENCOUNTER — Ambulatory Visit: Payer: PRIVATE HEALTH INSURANCE | Admitting: Obstetrics and Gynecology

## 2018-02-19 ENCOUNTER — Ambulatory Visit: Payer: PRIVATE HEALTH INSURANCE | Admitting: Obstetrics and Gynecology

## 2018-02-19 ENCOUNTER — Ambulatory Visit (INDEPENDENT_AMBULATORY_CARE_PROVIDER_SITE_OTHER): Payer: PRIVATE HEALTH INSURANCE | Admitting: Obstetrics and Gynecology

## 2018-02-19 ENCOUNTER — Encounter: Payer: Self-pay | Admitting: Obstetrics and Gynecology

## 2018-02-19 VITALS — BP 117/84 | HR 83 | Ht 63.0 in | Wt 186.5 lb

## 2018-02-19 DIAGNOSIS — Z6833 Body mass index (BMI) 33.0-33.9, adult: Secondary | ICD-10-CM | POA: Diagnosis not present

## 2018-02-19 DIAGNOSIS — E669 Obesity, unspecified: Secondary | ICD-10-CM

## 2018-02-19 MED ORDER — PHENTERMINE HCL 37.5 MG PO TABS: 37.5000 mg | ORAL_TABLET | Freq: Every day | ORAL | 2 refills | Status: DC

## 2018-02-19 NOTE — Progress Notes (Signed)
SUBJECTIVE:  42 y.o. here for follow-up weight loss visit, previously seen 8 weeks ago. Denies any concerns and feels like medication is still working to reduce appetitie and give her energy. It does however make her constipated and has to sop it every few days to have a BM with the help of Miralax. Has lost 8 lbs in 4 months. Is walking daily at work and taking the stairs, just trying to stay more active in general. Desires to continue with medications.  OBJECTIVE:  BP 117/84   Pulse 83   Ht 5\' 3"  (1.6 m)   Wt 186 lb 8 oz (84.6 kg)   LMP 01/22/2018   BMI 33.04 kg/m   Body mass index is 33.04 kg/m.  Waist circumference 39inches Patient appears well.  ASSESSMENT:  Obesity- responding well to weight loss plan PLAN:  To continue with current medications. B12 1080mcg/ml injection given RTC in 4 weeks as planned  Jeanne Diefendorf [place], CNM

## 2018-02-19 NOTE — Patient Instructions (Signed)
Great to see you today. Remember to switch your activities up every three months.  Goal to lose at least 9 lbs in the next two months.

## 2018-08-20 ENCOUNTER — Other Ambulatory Visit: Payer: Self-pay

## 2018-08-20 ENCOUNTER — Encounter: Payer: Self-pay | Admitting: Emergency Medicine

## 2018-08-20 ENCOUNTER — Ambulatory Visit
Admission: EM | Admit: 2018-08-20 | Discharge: 2018-08-20 | Disposition: A | Discharge status: Home / Self Care | Payer: PRIVATE HEALTH INSURANCE | Attending: Family Medicine | Admitting: Family Medicine

## 2018-08-20 DIAGNOSIS — B349 Viral infection, unspecified: Secondary | ICD-10-CM

## 2018-08-20 LAB — RAPID INFLUENZA A&B ANTIGENS (ARMC ONLY)
INFLUENZA A (ARMC): NEGATIVE
INFLUENZA B (ARMC): NEGATIVE

## 2018-08-20 MED ORDER — OSELTAMIVIR PHOSPHATE 75 MG PO CAPS: 75.0000 mg | ORAL_CAPSULE | Freq: Two times a day (BID) | ORAL | 0 refills | Status: DC

## 2018-08-20 NOTE — ED Provider Notes (Signed)
[facility]    CSN: [id number] Arrival date & time: 08/20/18  1340     History   Chief Complaint Chief Complaint  Patient presents with  . Generalized Body Aches    recent traveled from [place]  . Fever    HPI Marissa Rose is a 43 y.o. female.   43 yo female with a c/o fevers, chills, body aches, nasal congestion, cough for the past 2 days. States fever started yesterday. Patient states she recently returned from vacation in [place] where she was visiting [employer] and states she was staying at a hotel there as well. States that she was among crowds of people at [employer].   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    The history is provided by the patient.  Fever    Past Medical History:  Diagnosis Date  . Cancer [facility])    Basal Cell    Patient Active Problem List   Diagnosis Date Noted  . BMI 37.0-37.9, adult 12/07/2014  . S/P cesarean section 12/07/2014    Past Surgical History:  Procedure Laterality Date  . APPENDECTOMY    . CESAREAN SECTION     X 3  . CESAREAN SECTION WITH BILATERAL TUBAL LIGATION Right 12/07/2014   Procedure: CESAREAN SECTION WITH BILATERAL TUBAL LIGATION;  Surgeon: Will Bonnet, MD;  Location: [facility] ORS;  Service: Obstetrics;  Laterality: Right;  patient did not have left fallopian tube  . DILATION AND CURETTAGE OF UTERUS    . LEFT OOPHORECTOMY Left   . TONSILLECTOMY      OB History    Gravida  6   Para  4   Term  3   Preterm  0   AB  2   Living  4     SAB  1   TAB  0   Ectopic  1   Multiple  0   Live Births  4            Home Medications    Prior to Admission medications   Medication Sig Start Date End Date Taking? Authorizing Provider  cyanocobalamin (,VITAMIN B-12,) 1000 MCG/ML injection Inject 1 mL (1,000 mcg total) into the muscle every 30 (thirty) days. 09/23/17   Sh[place], Melody N, CNM   oseltamivir (TAMIFLU) 75 MG capsule Take 1 capsule (75 mg total) by mouth 2 (two) times daily. 08/20/18   Norval Gable, MD  phentermine (ADIPEX-P) 37.5 MG tablet Take 1 tablet (37.5 mg total) by mouth daily before breakfast. 02/19/18   Gayla Medicus, Melody N, CNM    Family History Family History  Problem Relation Age of Onset  . Hypertension Mother   . Diabetes Father     Social History Social History   Tobacco Use  . Smoking status: Former Smoker    Packs/day: 0.50    Types: Cigarettes    Last attempt to quit: 06/05/2003    Years since quitting: 15.2  . Smokeless tobacco: Never Used  Substance Use Topics  . Alcohol use: Yes    Comment: occass  . Drug use: No     Allergies   Sulfa antibiotics   Review of Systems Review of Systems  Constitutional: Positive for fever.     Physical Exam Triage Vital Signs ED Triage Vitals  Enc Vitals Group     BP 08/20/18 1400 118/80     Pulse Rate 08/20/18 1400  88     Resp 08/20/18 1400 20     Temp 08/20/18 1400 100.3 F (37.9 C)     Temp Source 08/20/18 1400 Oral     SpO2 08/20/18 1400 98 %     Weight 08/20/18 1356 190 lb (86.2 kg)     Height 08/20/18 1356 5\' 3"  (1.6 m)     Head Circumference --      Peak Flow --      Pain Score 08/20/18 1355 5     Pain Loc --      Pain Edu? --      Excl. in [place]? --    No data found.  Updated Vital Signs BP 118/80 (BP Location: Left Arm)   Pulse 88   Temp 100.3 F (37.9 C) (Oral)   Resp 20   Ht 5\' 3"  (1.6 m)   Wt 86.2 kg   LMP 08/06/2018 (Approximate)   SpO2 98%   BMI 33.66 kg/m   Visual Acuity Right Eye Distance:   Left Eye Distance:   Bilateral Distance:    Right Eye Near:   Left Eye Near:    Bilateral Near:     Physical Exam Vitals signs and nursing note reviewed.  Constitutional:      General: She is not in acute distress.    Appearance: She is ill-appearing. She is not toxic-appearing or diaphoretic.  Cardiovascular:     Rate and Rhythm: Normal rate.  Pulmonary:      Effort: Pulmonary effort is normal. No respiratory distress.     Breath sounds: Normal breath sounds.  Neurological:     Mental Status: She is alert.      UC Treatments / Results  Labs (all labs ordered are listed, but only abnormal results are displayed) Labs Reviewed  RAPID INFLUENZA A&B ANTIGENS ([facility] ONLY)  NOVEL CORONAVIRUS, NAA (HOSPITAL ORDER, SEND-OUT TO REF LAB)    EKG None  Radiology No results found.  Procedures Procedures (including critical care time)  Medications Ordered in UC Medications - No data to display  Initial Impression / Assessment and Plan / UC Course  I have reviewed the triage vital signs and the nursing notes.  Pertinent labs & imaging results that were available during my care of the patient were reviewed by me and considered in my medical decision making (see chart for details).      Final Clinical Impressions(s) / UC Diagnoses   Final diagnoses:  Viral syndrome     Discharge Instructions     Follow instructions packet given regarding COVID-19    ED Prescriptions    Medication Sig Dispense Auth. Provider   oseltamivir (TAMIFLU) 75 MG capsule Take 1 capsule (75 mg total) by mouth 2 (two) times daily. 10 capsule Norval Gable, MD     1. Lab results and diagnosis reviewed with patient; due to recent travel and possible exposure, recommend testing for coronavirus; information packet given per [place] DH[facility] 2. rx as per orders above; reviewed possible side effects, interactions, risks and benefits; empiric tx  3. Recommend supportive treatment with rest, fluids, otc analgesics 4. Follow-up prn  Controlled Substance Prescriptions [place] Controlled Substance Registry consulted? Not Applicable   Norval Gable, MD 08/20/18 [id number]

## 2018-08-20 NOTE — Discharge Instructions (Addendum)
Follow instructions packet given regarding COVID-19

## 2018-08-20 NOTE — ED Triage Notes (Signed)
Pt c/o fever (101), body aches, chills, and nasal congestion. Fever started yesterday. Congestion started 2 days ago. Pt recently returned back from [place]. Pt took ibuprofen about 4-5 hours ago.

## 2018-08-25 ENCOUNTER — Emergency Department
Admission: EM | Admit: 2018-08-25 | Discharge: 2018-08-25 | Disposition: A | Discharge status: Home / Self Care | Payer: PRIVATE HEALTH INSURANCE | Attending: Emergency Medicine | Admitting: Emergency Medicine

## 2018-08-25 ENCOUNTER — Emergency Department: Payer: PRIVATE HEALTH INSURANCE

## 2018-08-25 ENCOUNTER — Other Ambulatory Visit: Payer: Self-pay

## 2018-08-25 DIAGNOSIS — Z85828 Personal history of other malignant neoplasm of skin: Secondary | ICD-10-CM | POA: Diagnosis not present

## 2018-08-25 DIAGNOSIS — Z20822 Contact with and (suspected) exposure to covid-19: Secondary | ICD-10-CM

## 2018-08-25 DIAGNOSIS — R6889 Other general symptoms and signs: Secondary | ICD-10-CM

## 2018-08-25 DIAGNOSIS — R05 Cough: Secondary | ICD-10-CM | POA: Diagnosis present

## 2018-08-25 DIAGNOSIS — Z20828 Contact with and (suspected) exposure to other viral communicable diseases: Secondary | ICD-10-CM | POA: Insufficient documentation

## 2018-08-25 LAB — CBC
HEMATOCRIT: 41.6 % (ref 36.0–46.0)
HEMOGLOBIN: 14.2 g/dL (ref 12.0–15.0)
MCH: 32.9 pg (ref 26.0–34.0)
MCHC: 34.1 g/dL (ref 30.0–36.0)
MCV: 96.3 fL (ref 80.0–100.0)
PLATELETS: 162 10*3/uL (ref 150–400)
RBC: 4.32 MIL/uL (ref 3.87–5.11)
RDW: 12.6 % (ref 11.5–15.5)
WBC: 3.5 10*3/uL — ABNORMAL LOW (ref 4.0–10.5)
nRBC: 0 % (ref 0.0–0.2)

## 2018-08-25 LAB — BASIC METABOLIC PANEL
ANION GAP: 9 (ref 5–15)
BUN: 12 mg/dL (ref 6–20)
CHLORIDE: 101 mmol/L (ref 98–111)
CO2: 27 mmol/L (ref 22–32)
Calcium: 9.3 mg/dL (ref 8.9–10.3)
Creatinine, Ser: 0.66 mg/dL (ref 0.44–1.00)
GFR calc non Af Amer: >60 mL/min (ref >60)
GLUCOSE: 98 mg/dL (ref 70–99)
POTASSIUM: 4.8 mmol/L (ref 3.5–5.1)
Sodium: 137 mmol/L (ref 135–145)

## 2018-08-25 LAB — PROCALCITONIN: Procalcitonin: <0.1 ng/mL

## 2018-08-25 LAB — URINALYSIS, COMPLETE (UACMP) WITH MICROSCOPIC
BILIRUBIN URINE: NEGATIVE
Glucose, UA: NEGATIVE mg/dL
Hgb urine dipstick: NEGATIVE
Ketones, ur: NEGATIVE mg/dL
LEUKOCYTE UA: NEGATIVE
NITRITE: NEGATIVE
Protein, ur: 30 mg/dL — AB
SPECIFIC GRAVITY, URINE: 1.032 — AB (ref 1.005–1.030)
pH: 5 (ref 5.0–8.0)

## 2018-08-25 LAB — CBC WITH DIFFERENTIAL/PLATELET
Abs Immature Granulocytes: 0.01 10*3/uL (ref 0.00–0.07)
Basophils Absolute: 0 10*3/uL (ref 0.0–0.1)
Basophils Relative: 0 %
EOS ABS: 0 10*3/uL (ref 0.0–0.5)
Eosinophils Relative: 0 %
HCT: 41.1 % (ref 36.0–46.0)
Hemoglobin: 14.1 g/dL (ref 12.0–15.0)
Immature Granulocytes: 0 %
Lymphocytes Relative: 34 %
Lymphs Abs: 1.2 10*3/uL (ref 0.7–4.0)
MCH: 32.6 pg (ref 26.0–34.0)
MCHC: 34.3 g/dL (ref 30.0–36.0)
MCV: 94.9 fL (ref 80.0–100.0)
Monocytes Absolute: 0.3 10*3/uL (ref 0.1–1.0)
Monocytes Relative: 8 %
Neutro Abs: 2 10*3/uL (ref 1.7–7.7)
Neutrophils Relative %: 58 %
Platelets: 172 10*3/uL (ref 150–400)
RBC: 4.33 MIL/uL (ref 3.87–5.11)
RDW: 12.5 % (ref 11.5–15.5)
WBC: 3.4 10*3/uL — ABNORMAL LOW (ref 4.0–10.5)
nRBC: 0 % (ref 0.0–0.2)

## 2018-08-25 LAB — POCT PREGNANCY, URINE: Preg Test, Ur: NEGATIVE

## 2018-08-25 LAB — FERRITIN: Ferritin: 177 ng/mL (ref 11–307)

## 2018-08-25 LAB — TROPONIN I: Troponin I: <0.03 ng/mL (ref <0.03)

## 2018-08-25 MED ORDER — AZITHROMYCIN 500 MG PO TABS
500.0000 mg | ORAL_TABLET | Freq: Once | ORAL | Status: AC
  Administered 2018-08-25: 500 mg via ORAL
  Filled 2018-08-25: qty 1

## 2018-08-25 MED ORDER — HYDROCODONE-HOMATROPINE 5-1.5 MG/5ML PO SYRP
5.0000 mL | ORAL_SOLUTION | ORAL | Status: AC
  Administered 2018-08-25: 5 mL via ORAL
  Filled 2018-08-25: qty 5

## 2018-08-25 MED ORDER — ONDANSETRON 4 MG PO TBDP
4.0000 mg | ORAL_TABLET | Freq: Once | ORAL | Status: AC
  Administered 2018-08-25: 4 mg via ORAL
  Filled 2018-08-25: qty 1

## 2018-08-25 MED ORDER — AZITHROMYCIN 250 MG PO TABS: ORAL_TABLET | ORAL | 0 refills | Status: DC

## 2018-08-25 NOTE — ED Triage Notes (Addendum)
Pt seen and tested for corona virus at urgent care Wednesday this week; no results yet.  Pt sent here because returned there with worsening fever and having chest pain.  Pt went to [facility] 3/11-3/16 of this month.  C/o pain across upper back and pain to chest intermittently.  No pain in chest at this time but is having back pain.  VSS, unlabored.  Temp 101 and took tylenol at 1030.

## 2018-08-25 NOTE — Discharge Instructions (Signed)
Person Under Monitoring Name: Marissa Rose  Location: [address] [place] [address]   Infection Prevention Recommendations for Individuals Confirmed to have, or Being Evaluated for, 2019 Novel Coronavirus (COVID-19) Infection Who Receive Care at Home  Individuals who are confirmed to have, or are being evaluated for, COVID-19 should follow the prevention steps below until a healthcare provider or local or state health department says they can return to normal activities.  Stay home except to get medical care You should restrict activities outside your home, except for getting medical care. Do not go to work, school, or public areas, and do not use public transportation or taxis.  Call ahead before visiting your doctor Before your medical appointment, call the healthcare provider and tell them that you have, or are being evaluated for, COVID-19 infection. This will help the healthcare providers office take steps to keep other people from getting infected. Ask your healthcare provider to call the local or state health department.  Monitor your symptoms Seek prompt medical attention if your illness is worsening (e.g., difficulty breathing). Before going to your medical appointment, call the healthcare provider and tell them that you have, or are being evaluated for, COVID-19 infection. Ask your healthcare provider to call the local or state health department.  Wear a facemask You should wear a facemask that covers your nose and mouth when you are in the same room with other people and when you visit a healthcare provider. People who live with or visit you should also wear a facemask while they are in the same room with you.  Separate yourself from other people in your home As much as possible, you should stay in a different room from other people in your home. Also, you should use a separate bathroom, if available.  Avoid sharing household items You should not share  dishes, drinking glasses, cups, eating utensils, towels, bedding, or other items with other people in your home. After using these items, you should wash them thoroughly with soap and water.  Cover your coughs and sneezes Cover your mouth and nose with a tissue when you cough or sneeze, or you can cough or sneeze into your sleeve. Throw used tissues in a lined trash can, and immediately wash your hands with soap and water for at least 20 seconds or use an alcohol-based hand rub.  Wash your [facility] your hands often and thoroughly with soap and water for at least 20 seconds. You can use an alcohol-based hand sanitizer if soap and water are not available and if your hands are not visibly dirty. Avoid touching your eyes, nose, and mouth with unwashed hands.   Prevention Steps for Caregivers and Household Members of Individuals Confirmed to have, or Being Evaluated for, COVID-19 Infection Being Cared for in the Home  If you live with, or provide care at home for, a person confirmed to have, or being evaluated for, COVID-19 infection please follow these guidelines to prevent infection:  Follow healthcare providers instructions Make sure that you understand and can help the patient follow any healthcare provider instructions for all care.  Provide for the patients basic needs You should help the patient with basic needs in the home and provide support for getting groceries, prescriptions, and other personal needs.  Monitor the patients symptoms If they are getting sicker, call his or her medical provider and tell them that the patient has, or is being evaluated for, COVID-19 infection. This will help the healthcare providers office take  steps to keep other people from getting infected. Ask the healthcare provider to call the local or state health department.  Limit the number of people who have contact with the patient If possible, have only one caregiver for the patient. Other  household members should stay in another home or place of residence. If this is not possible, they should stay in another room, or be separated from the patient as much as possible. Use a separate bathroom, if available. Restrict visitors who do not have an essential need to be in the home.  Keep older adults, very young children, and other sick people away from the patient Keep older adults, very young children, and those who have compromised immune systems or chronic health conditions away from the patient. This includes people with chronic heart, lung, or kidney conditions, diabetes, and cancer.  Ensure good ventilation Make sure that shared spaces in the home have good air flow, such as from an air conditioner or an opened window, weather permitting.  Wash your hands often Wash your hands often and thoroughly with soap and water for at least 20 seconds. You can use an alcohol based hand sanitizer if soap and water are not available and if your hands are not visibly dirty. Avoid touching your eyes, nose, and mouth with unwashed hands. Use disposable paper towels to dry your hands. If not available, use dedicated cloth towels and replace them when they become wet.  Wear a facemask and gloves Wear a disposable facemask at all times in the room and gloves when you touch or have contact with the patients blood, body fluids, and/or secretions or excretions, such as sweat, saliva, sputum, nasal mucus, vomit, urine, or feces.  Ensure the mask fits over your nose and mouth tightly, and do not touch it during use. Throw out disposable facemasks and gloves after using them. Do not reuse. Wash your hands immediately after removing your facemask and gloves. If your personal clothing becomes contaminated, carefully remove clothing and launder. Wash your hands after handling contaminated clothing. Place all used disposable facemasks, gloves, and other waste in a lined container before disposing them with  other household waste. Remove gloves and wash your hands immediately after handling these items.  Do not share dishes, glasses, or other household items with the patient Avoid sharing household items. You should not share dishes, drinking glasses, cups, eating utensils, towels, bedding, or other items with a patient who is confirmed to have, or being evaluated for, COVID-19 infection. After the person uses these items, you should wash them thoroughly with soap and water.  Wash laundry thoroughly Immediately remove and wash clothes or bedding that have blood, body fluids, and/or secretions or excretions, such as sweat, saliva, sputum, nasal mucus, vomit, urine, or feces, on them. Wear gloves when handling laundry from the patient. Read and follow directions on labels of laundry or clothing items and detergent. In general, wash and dry with the warmest temperatures recommended on the label.  Clean all areas the individual has used often Clean all touchable surfaces, such as counters, tabletops, doorknobs, bathroom fixtures, toilets, phones, keyboards, tablets, and bedside tables, every day. Also, clean any surfaces that may have blood, body fluids, and/or secretions or excretions on them. Wear gloves when cleaning surfaces the patient has come in contact with. Use a diluted bleach solution (e.g., dilute bleach with 1 part bleach and 10 parts water) or a household disinfectant with a label that says EPA-registered for coronaviruses. To make a bleach solution  at home, add 1 tablespoon of bleach to 1 quart (4 cups) of water. For a larger supply, add  cup of bleach to 1 gallon (16 cups) of water. Read labels of cleaning products and follow recommendations provided on product labels. Labels contain instructions for safe and effective use of the cleaning product including precautions you should take when applying the product, such as wearing gloves or eye protection and making sure you have good ventilation  during use of the product. Remove gloves and wash hands immediately after cleaning.  Monitor yourself for signs and symptoms of illness Caregivers and household members are considered close contacts, should monitor their health, and will be asked to limit movement outside of the home to the extent possible. Follow the monitoring steps for close contacts listed on the symptom monitoring form.   ? If you have additional questions, contact your local health department or call the epidemiologist on call at [phone] (available 24/7). ? This guidance is subject to change. For the most up-to-date guidance from [facility], please refer to their website: [email or web address]

## 2018-08-25 NOTE — ED Provider Notes (Signed)
[facility] Emergency Department Provider Note   ____________________________________________   First MD Initiated Contact with Patient 08/25/18 1242     (approximate)  I have reviewed the triage vital signs and the nursing notes.   HISTORY  Chief Complaint Cough and Chest Pain    HPI Marissa Rose is a 43 y.o. female here for evaluation for ongoing cough and fever  Patient reports that she traveled to [place] returning March 16.  She started shortly after having symptoms of fever and cough.  She went to urgent care and was negative for the flu and had a coronavirus test sent that has not returned yet  She reports she continues to have fevers, cough, body aches and chills.  No shortness of breath.  She does report frequent coughing.  Achiness in the body.  Discomfort in most of her muscles and a light headache  No chest pain.  No shortness of breath.  No leg swelling.  No history of any blood clots.  Denies major medical history.  Denies pregnancy.   Past Medical History:  Diagnosis Date   Cancer [facility])    Basal Cell    Patient Active Problem List   Diagnosis Date Noted   BMI 37.0-37.9, adult 12/07/2014   S/P cesarean section 12/07/2014    Past Surgical History:  Procedure Laterality Date   APPENDECTOMY     CESAREAN SECTION     X 3   CESAREAN SECTION WITH BILATERAL TUBAL LIGATION Right 12/07/2014   Procedure: CESAREAN SECTION WITH BILATERAL TUBAL LIGATION;  Surgeon: Will Bonnet, MD;  Location: [facility] ORS;  Service: Obstetrics;  Laterality: Right;  patient did not have left fallopian tube   DILATION AND CURETTAGE OF UTERUS     LEFT OOPHORECTOMY Left    TONSILLECTOMY      Prior to Admission medications   Medication Sig Start Date End Date Taking? Authorizing Provider  azithromycin (ZITHROMAX) 250 MG tablet 1 tab daily by mouth 08/25/18   Delman Kitten, MD  cyanocobalamin (,VITAMIN B-12,) 1000 MCG/ML injection Inject 1 mL (1,000 mcg  total) into the muscle every 30 (thirty) days. 09/23/17   Shambley, Melody N, CNM  oseltamivir (TAMIFLU) 75 MG capsule Take 1 capsule (75 mg total) by mouth 2 (two) times daily. 08/20/18   Norval Gable, MD  phentermine (ADIPEX-P) 37.5 MG tablet Take 1 tablet (37.5 mg total) by mouth daily before breakfast. 02/19/18   Shambley, Melody N, CNM    Allergies Sulfa antibiotics  Family History  Problem Relation Age of Onset   Hypertension Mother    Diabetes Father     Social History Social History   Tobacco Use   Smoking status: Former Smoker    Packs/day: 0.50    Types: Cigarettes    Last attempt to quit: 06/05/2003    Years since quitting: 15.2   Smokeless tobacco: Never Used  Substance Use Topics   Alcohol use: Yes    Comment: occass   Drug use: No    Review of Systems Constitutional: Fevers and chills and some fatigue eyes: No visual changes. ENT: No sore throat. Cardiovascular: Denies chest pain. Respiratory: Denies shortness of breath.  Recurrent dry cough. Gastrointestinal: No abdominal pain.   Genitourinary: Negative for dysuria.  Denies pregnancy. Musculoskeletal: As of AC achy muscle pains especially in her back Skin: Negative for rash. Neurological: Negative for  areas of focal weakness or numbness.    ____________________________________________   PHYSICAL EXAM:  VITAL SIGNS: ED Triage Vitals  Enc Vitals Group     BP 08/25/18 1230 125/79     Pulse Rate 08/25/18 1230 93     Resp 08/25/18 1230 20     Temp 08/25/18 1230 98.8 F (37.1 C)     Temp Source 08/25/18 1230 Oral     SpO2 08/25/18 1230 98 %     Weight 08/25/18 1233 190 lb (86.2 kg)     Height 08/25/18 1233 5\' 3"  (1.6 m)     Head Circumference --      Peak Flow --      Pain Score 08/25/18 1233 6     Pain Loc --      Pain Edu? --      Excl. in [place]? --     Constitutional: Alert and oriented. Well appearing and in no acute distress.  She does appear mildly ill.  Sitting up in chair.  Very  pleasant and conversant. Eyes: Conjunctivae are normal. Head: Atraumatic. Nose: No congestion/rhinnorhea. Mouth/Throat: Mucous membranes are moist. Neck: No stridor.  Cardiovascular: Normal rate, regular rhythm. Grossly normal heart sounds.  Good peripheral circulation. Respiratory: Normal respiratory effort.  No retractions. Lungs CTAB.  She does have a frequent dry nonproductive cough. Gastrointestinal: Soft and nontender. No distention. Musculoskeletal: No lower extremity tenderness nor edema. Neurologic:  Normal speech and language. No gross focal neurologic deficits are appreciated.  Skin:  Skin is warm, dry and intact. No rash noted. Psychiatric: Mood and affect are normal. Speech and behavior are normal.  ____________________________________________   LABS (all labs ordered are listed, but only abnormal results are displayed)  Labs Reviewed  CBC - Abnormal; Notable for the following components:      Result Value   WBC 3.5 (*)    All other components within normal limits  URINALYSIS, COMPLETE (UACMP) WITH MICROSCOPIC - Abnormal; Notable for the following components:   Color, Urine AMBER (*)    APPearance CLOUDY (*)    Specific Gravity, Urine 1.032 (*)    Protein, ur 30 (*)    Bacteria, UA MANY (*)    All other components within normal limits  CBC WITH DIFFERENTIAL/PLATELET - Abnormal; Notable for the following components:   WBC 3.4 (*)    All other components within normal limits  URINE CULTURE  BASIC METABOLIC PANEL  TROPONIN I  PROCALCITONIN  FERRITIN  POC URINE PREG, ED  POCT PREGNANCY, URINE  CBG MONITORING, ED   ____________________________________________  EKG  Reviewed by me at 1235 Heart rate 95 QRS 80 QTc 440 Normal EKG, normal T waves ____________________________________________  RADIOLOGY  Dg Chest Portable 1 View  Result Date: 08/25/2018 CLINICAL DATA:  Fever and chest pain for several days. EXAM: PORTABLE CHEST 1 VIEW COMPARISON:  None.  FINDINGS: Small foci of patchy airspace opacity are identified bilaterally. No pneumothorax or pleural effusion. Heart size is normal. No acute or focal bony abnormality. IMPRESSION: Subtle foci of patchy airspace opacity bilaterally are nonspecific but could represent pneumonia, including atypical pneumonia. Electronically Signed   By: Inge Rise M.D.   On: 08/25/2018 13:54     ____________________________________________   PROCEDURES  Procedure(s) performed: None  Procedures  Critical Care performed: No  ____________________________________________   INITIAL IMPRESSION / ASSESSMENT AND PLAN / ED COURSE  Pertinent labs & imaging results that were available during my care of the patient were reviewed by me and considered in my medical decision making (see chart for details).   Patient presents for ongoing waxing waning fevers, fatigue, persistent dry  cough and now reports having developed a lot of achiness in the muscles of her back.  She denies having "chest pain" but rather reports discomfort is in the muscles of her back.  She has had a negative flu test.  Denies productive cough.  She has been socially isolating from members of her family and society.  She thinks she may have coronavirus.  In my assessment, I suspect the patient is definitely at risk for coronavirus.  At the present time she is nontoxic normal oxygen saturations and normal work of breathing.  Does certainly have significant myalgias and is been having waxing and waning fevers at home for several days.  I will exclude pneumonia.  She has been excluded for flu.  She is nontoxic at this time.  Also check urinalysis given the extent of muscle aches and fever to rule out urinary tract infection though she currently does not report any urinary symptoms or dysuria but given the ongoing fever we will evaluate this  My overall assessment is that the patient may very well have coronavirus.  Still waiting on her testing to  return.  She does however appear well and does not meet any evidence to support a need for admission    Clinical Course as of Aug 24 1549  Mon Aug 25, 2018  1318 WBC count low. Suggestive of viral, increases my thought this is Coronavirus.    [MQ]  1410 Case and care discussed with Dr. Rocky Morel of infectious disease.    [MQ]  27 Dr. Delaine Lame advises oral azithromycin for CAP treatment. Await procal. If < 0.5 very unlikely bacterial in origin.    [MQ]    Clinical Course User Index [MQ] Delman Kitten, MD   ----------------------------------------- 3:49 PM on 08/25/2018 -----------------------------------------  With the patient's reduced white count, clinical history I am very suspicious she has coronavirus she also has some slight patchy infiltrates on her x-ray.  I discussed this case with infectious disease, reviewed her clinical course vital signs and she is nontoxic and well-appearing at this time.  We will discharge her with azithromycin for treatment of atypical or community-acquired pneumonia in the event that this does exist though with her low procalcitonin I highly suspect this to be a viral etiology.  Careful return precautions including home monitoring are discussed with the patient.  My preliminary diagnosis at this point is this is likely coronavirus or other respiratory virus.  She has no signs or symptoms of hypoxia or increased work of breathing that would suggest clear development or concern for ARDS at this time.  Return precautions and treatment recommendations and follow-up discussed with the patient who is agreeable with the plan.   ____________________________________________   FINAL CLINICAL IMPRESSION(S) / ED DIAGNOSES  Final diagnoses:  Suspected Covid-19 Virus Infection        Note:  This document was prepared using Dragon voice recognition software and may include unintentional dictation errors       Delman Kitten, MD 08/25/18 1551

## 2018-08-26 LAB — URINE CULTURE
Culture: <10000 — AB
SPECIAL REQUESTS: NORMAL

## 2018-08-29 LAB — NOVEL CORONAVIRUS, NAA (HOSP ORDER, SEND-OUT TO REF LAB; TAT 18-24 HRS): SARS-CoV-2, NAA: DETECTED — AB

## 2018-09-01 ENCOUNTER — Telehealth: Payer: Self-pay | Admitting: Emergency Medicine

## 2018-09-01 NOTE — Telephone Encounter (Signed)
Called to inform patient of positive covid 19 test.  Left message asking her to call me.

## 2018-12-22 ENCOUNTER — Emergency Department: Payer: PRIVATE HEALTH INSURANCE

## 2018-12-22 ENCOUNTER — Other Ambulatory Visit: Payer: Self-pay

## 2018-12-22 ENCOUNTER — Emergency Department
Admission: EM | Admit: 2018-12-22 | Discharge: 2018-12-22 | Disposition: A | Discharge status: Home / Self Care | Payer: PRIVATE HEALTH INSURANCE | Attending: Emergency Medicine | Admitting: Emergency Medicine

## 2018-12-22 ENCOUNTER — Ambulatory Visit (INDEPENDENT_AMBULATORY_CARE_PROVIDER_SITE_OTHER)
Admission: EM | Admit: 2018-12-22 | Discharge: 2018-12-22 | Disposition: A | Discharge status: Home / Self Care | Payer: PRIVATE HEALTH INSURANCE | Source: Home / Self Care | Attending: Family Medicine | Admitting: Family Medicine

## 2018-12-22 ENCOUNTER — Encounter: Payer: Self-pay | Admitting: Emergency Medicine

## 2018-12-22 ENCOUNTER — Ambulatory Visit (INDEPENDENT_AMBULATORY_CARE_PROVIDER_SITE_OTHER)
Admit: 2018-12-22 | Discharge: 2018-12-22 | Disposition: A | Discharge status: Home / Self Care | Payer: PRIVATE HEALTH INSURANCE | Attending: Family Medicine | Admitting: Family Medicine

## 2018-12-22 DIAGNOSIS — R102 Pelvic and perineal pain: Secondary | ICD-10-CM | POA: Diagnosis not present

## 2018-12-22 DIAGNOSIS — Z3202 Encounter for pregnancy test, result negative: Secondary | ICD-10-CM

## 2018-12-22 DIAGNOSIS — R109 Unspecified abdominal pain: Secondary | ICD-10-CM | POA: Diagnosis not present

## 2018-12-22 DIAGNOSIS — R935 Abnormal findings on diagnostic imaging of other abdominal regions, including retroperitoneum: Secondary | ICD-10-CM

## 2018-12-22 DIAGNOSIS — Z87891 Personal history of nicotine dependence: Secondary | ICD-10-CM | POA: Diagnosis not present

## 2018-12-22 DIAGNOSIS — Z85828 Personal history of other malignant neoplasm of skin: Secondary | ICD-10-CM | POA: Diagnosis not present

## 2018-12-22 DIAGNOSIS — R1030 Lower abdominal pain, unspecified: Secondary | ICD-10-CM | POA: Diagnosis present

## 2018-12-22 DIAGNOSIS — K5792 Diverticulitis of intestine, part unspecified, without perforation or abscess without bleeding: Secondary | ICD-10-CM | POA: Diagnosis not present

## 2018-12-22 DIAGNOSIS — Z79899 Other long term (current) drug therapy: Secondary | ICD-10-CM | POA: Diagnosis not present

## 2018-12-22 LAB — COMPREHENSIVE METABOLIC PANEL
ALT: 104 U/L — ABNORMAL HIGH (ref 0–44)
AST: 72 U/L — ABNORMAL HIGH (ref 15–41)
Albumin: 4.4 g/dL (ref 3.5–5.0)
Alkaline Phosphatase: 181 U/L — ABNORMAL HIGH (ref 38–126)
Anion gap: 11 (ref 5–15)
BUN: 15 mg/dL (ref 6–20)
CO2: 23 mmol/L (ref 22–32)
Calcium: 9.5 mg/dL (ref 8.9–10.3)
Chloride: 103 mmol/L (ref 98–111)
Creatinine, Ser: 0.6 mg/dL (ref 0.44–1.00)
GFR calc Af Amer: >60 mL/min (ref >60)
GFR calc non Af Amer: >60 mL/min (ref >60)
Glucose, Bld: 123 mg/dL — ABNORMAL HIGH (ref 70–99)
Potassium: 3.7 mmol/L (ref 3.5–5.1)
Sodium: 137 mmol/L (ref 135–145)
Total Bilirubin: 0.9 mg/dL (ref 0.3–1.2)
Total Protein: 7.6 g/dL (ref 6.5–8.1)

## 2018-12-22 LAB — CBC
HCT: 38.9 % (ref 36.0–46.0)
Hemoglobin: 13.8 g/dL (ref 12.0–15.0)
MCH: 33.4 pg (ref 26.0–34.0)
MCHC: 35.5 g/dL (ref 30.0–36.0)
MCV: 94.2 fL (ref 80.0–100.0)
Platelets: 269 10*3/uL (ref 150–400)
RBC: 4.13 MIL/uL (ref 3.87–5.11)
RDW: 12.4 % (ref 11.5–15.5)
WBC: 12.2 10*3/uL — ABNORMAL HIGH (ref 4.0–10.5)
nRBC: 0 % (ref 0.0–0.2)

## 2018-12-22 LAB — URINALYSIS, COMPLETE (UACMP) WITH MICROSCOPIC
Bilirubin Urine: NEGATIVE
Glucose, UA: NEGATIVE mg/dL
Hgb urine dipstick: NEGATIVE
Ketones, ur: NEGATIVE mg/dL
Leukocytes,Ua: NEGATIVE
Nitrite: NEGATIVE
Protein, ur: NEGATIVE mg/dL
Specific Gravity, Urine: 1.025 (ref 1.005–1.030)
pH: 5.5 (ref 5.0–8.0)

## 2018-12-22 LAB — PREGNANCY, URINE: Preg Test, Ur: NEGATIVE

## 2018-12-22 MED ORDER — ONDANSETRON 8 MG PO TBDP: 8.0000 mg | ORAL_TABLET | Freq: Three times a day (TID) | ORAL | 0 refills | Status: DC | PRN

## 2018-12-22 MED ORDER — IOHEXOL 240 MG/ML SOLN
25.0000 mL | INTRAMUSCULAR | Status: AC
  Administered 2018-12-22: 25 mL via ORAL

## 2018-12-22 MED ORDER — KETOROLAC TROMETHAMINE 10 MG PO TABS: 10.0000 mg | ORAL_TABLET | Freq: Three times a day (TID) | ORAL | 0 refills | Status: DC | PRN

## 2018-12-22 MED ORDER — TRAMADOL HCL 50 MG PO TABS: 50.0000 mg | ORAL_TABLET | Freq: Four times a day (QID) | ORAL | 0 refills | Status: AC | PRN

## 2018-12-22 MED ORDER — FLUCONAZOLE 150 MG PO TABS: 150.0000 mg | ORAL_TABLET | Freq: Once | ORAL | 0 refills | Status: AC

## 2018-12-22 MED ORDER — IOHEXOL 300 MG/ML  SOLN
100.0000 mL | Freq: Once | INTRAMUSCULAR | Status: AC | PRN
  Administered 2018-12-22: 100 mL via INTRAVENOUS

## 2018-12-22 MED ORDER — CIPROFLOXACIN HCL 500 MG PO TABS: 500.0000 mg | ORAL_TABLET | Freq: Two times a day (BID) | ORAL | 0 refills | Status: DC

## 2018-12-22 MED ORDER — TRAMADOL HCL 50 MG PO TABS
50.0000 mg | ORAL_TABLET | Freq: Once | ORAL | Status: AC
  Administered 2018-12-22: 50 mg via ORAL
  Filled 2018-12-22: qty 1

## 2018-12-22 MED ORDER — KETOROLAC TROMETHAMINE 60 MG/2ML IM SOLN
60.0000 mg | Freq: Once | INTRAMUSCULAR | Status: AC
  Administered 2018-12-22: 60 mg via INTRAMUSCULAR

## 2018-12-22 MED ORDER — CIPROFLOXACIN HCL 500 MG PO TABS: 500.0000 mg | ORAL_TABLET | Freq: Two times a day (BID) | ORAL | 0 refills | Status: AC

## 2018-12-22 MED ORDER — METRONIDAZOLE 500 MG PO TABS: 500.0000 mg | ORAL_TABLET | Freq: Three times a day (TID) | ORAL | 0 refills | Status: AC

## 2018-12-22 MED ORDER — SODIUM CHLORIDE 0.9% FLUSH: 3.0000 mL | Freq: Once | INTRAVENOUS | Status: DC

## 2018-12-22 MED ORDER — CIPROFLOXACIN IN D5W 400 MG/200ML IV SOLN
400.0000 mg | Freq: Once | INTRAVENOUS | Status: AC
  Administered 2018-12-22: 400 mg via INTRAVENOUS
  Filled 2018-12-22: qty 200

## 2018-12-22 MED ORDER — METRONIDAZOLE IN NACL 5-0.79 MG/ML-% IV SOLN
500.0000 mg | Freq: Once | INTRAVENOUS | Status: AC
  Administered 2018-12-22: 500 mg via INTRAVENOUS
  Filled 2018-12-22: qty 100

## 2018-12-22 NOTE — ED Provider Notes (Signed)
[facility]    CSN: [id number] Arrival date & time: 12/22/18  0809     History   Chief Complaint Chief Complaint  Patient presents with  . Pelvic Pain    HPI Marissa Rose is a 43 y.o. female.   43 yo female with a c/o lower pelvic/abdominal pain since yesterday. States she had to leave early from work today at [facility] due to pain. Pain is constant at around 2-3/10 with intermittent spontaneous worsening to 8/10. Denies any diarrhea, constipation, fevers, chills, vomiting, known sick contacts, dysuria, vaginal bleeding, vaginal discharge.      Past Medical History:  Diagnosis Date  . 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 BMI 37.0-37.9, adult 12/07/2014  . S/P cesarean section 12/07/2014    Past Surgical History:  Procedure Laterality Date  . APPENDECTOMY    . CESAREAN SECTION     X 3  . CESAREAN SECTION WITH BILATERAL TUBAL LIGATION Right 12/07/2014   Procedure: CESAREAN SECTION WITH BILATERAL TUBAL LIGATION;  Surgeon: Will Bonnet, MD;  Location: [facility] ORS;  Service: Obstetrics;  Laterality: Right;  patient did not have left fallopian tube  . DILATION AND CURETTAGE OF UTERUS    . LEFT OOPHORECTOMY Left   . TONSILLECTOMY      OB History    Gravida  6   Para  4   Term  3   Preterm  0   AB  2   Living  4     SAB  1   TAB  0   Ectopic  1   Multiple  0   Live Births  4            Home Medications    Prior to Admission medications   Medication Sig Start Date End Date Taking? Authorizing Provider  azithromycin (ZITHROMAX) 250 MG tablet 1 tab daily by mouth 08/25/18   Delman Kitten, MD  ciprofloxacin (CIPRO) 500 MG tablet Take 1 tablet (500 mg total) by mouth 2 (two) times daily for 10 days. 12/22/18 01/01/19  Arta Silence, MD  cyanocobalamin (,VITAMIN B-12,) 1000 MCG/ML injection Inject 1 mL (1,000 mcg total) into the muscle every 30 (thirty) days. 09/23/17   Shambley,  Melody N, CNM  fluconazole (DIFLUCAN) 150 MG tablet Take 1 tablet (150 mg total) by mouth once for 1 dose. If you develop yeast infection symptoms after the antibiotics 12/22/18 12/22/18  Arta Silence, MD  ketorolac (TORADOL) 10 MG tablet Take 1 tablet (10 mg total) by mouth every 8 (eight) hours as needed. 12/22/18   Norval Gable, MD  metroNIDAZOLE (FLAGYL) 500 MG tablet Take 1 tablet (500 mg total) by mouth 3 (three) times daily for 10 days. 12/22/18 01/01/19  Arta Silence, MD  ondansetron (ZOFRAN ODT) 8 MG disintegrating tablet Take 1 tablet (8 mg total) by mouth every 8 (eight) hours as needed. 12/22/18   Arta Silence, MD  oseltamivir (TAMIFLU) 75 MG capsule Take 1 capsule (75 mg total) by mouth 2 (two) times daily. 08/20/18   Norval Gable, MD  phentermine (ADIPEX-P) 37.5 MG tablet Take 1 tablet (37.5 mg total) by mouth daily before breakfast. 02/19/18   Shambley, Melody N, CNM  traMADol (ULTRAM) 50 MG tablet Take 1 tablet (50 mg total) by mouth every 6 (six) hours as needed for up to 5 days. 12/22/18 12/27/18  Arta Silence, MD    Family History Family History  Problem Relation Age of Onset  . Hypertension Mother   . Diabetes Father     Social History Social History   Tobacco Use  . Smoking status: Former Smoker    Packs/day: 0.50    Types: Cigarettes    Quit date: 06/05/2003    Years since quitting: 15.5  . Smokeless tobacco: Never Used  Substance Use Topics  . Alcohol use: Yes    Comment: occass  . Drug use: No     Allergies   Sulfa antibiotics   Review of Systems Review of Systems   Physical Exam Triage Vital Signs ED Triage Vitals  Enc Vitals Group     BP 12/22/18 0819 124/80     Pulse Rate 12/22/18 0819 86     Resp 12/22/18 0819 17     Temp 12/22/18 0819 98.1 F (36.7 C)     Temp Source 12/22/18 0819 Oral     SpO2 12/22/18 0819 100 %     Weight 12/22/18 0816 205 lb (93 kg)     Height 12/22/18 0816 5\' 3"  (1.6 m)     Head Circumference  --      Peak Flow --      Pain Score 12/22/18 0815 5     Pain Loc --      Pain Edu? --      Excl. in [place]? --    No data found.  Updated Vital Signs BP 124/80 (BP Location: Right Arm)   Pulse 86   Temp 98.1 F (36.7 C) (Oral)   Resp 17   Ht 5\' 3"  (1.6 m)   Wt 93 kg   LMP 12/15/2018   SpO2 100%   BMI 36.31 kg/m   Visual Acuity Right Eye Distance:   Left Eye Distance:   Bilateral Distance:    Right Eye Near:   Left Eye Near:    Bilateral Near:     Physical Exam Vitals signs and nursing note reviewed.  Constitutional:      General: She is not in acute distress.    Appearance: She is not toxic-appearing or diaphoretic.     Comments: Uncomfortable appearing secondary to pain  Pulmonary:     Effort: Pulmonary effort is normal. No respiratory distress.  Abdominal:     General: Bowel sounds are normal. There is no distension.     Palpations: Abdomen is soft. There is no mass.     Tenderness: There is abdominal tenderness (mid and left lower abdomen). There is no right CVA tenderness, left CVA tenderness, guarding or rebound.     Hernia: No hernia is present.  Neurological:     Mental Status: She is alert.      UC Treatments / Results  Labs (all labs ordered are listed, but only abnormal results are displayed) Labs Reviewed  URINALYSIS, COMPLETE (UACMP) WITH MICROSCOPIC - Abnormal; Notable for the following components:      Result Value   APPearance HAZY (*)    Bacteria, UA FEW (*)    All other components within normal limits  URINE CULTURE  PREGNANCY, URINE    EKG   Radiology Ct Abdomen Pelvis W Contrast  Result Date: 12/22/2018 CLINICAL DATA:  Pelvic and left lower quadrant pain for 2 days, recent ultrasound suggests areas of thickened bowel wall. EXAM: CT ABDOMEN AND PELVIS WITH CONTRAST TECHNIQUE: Multidetector CT imaging of the abdomen and pelvis was performed using the standard protocol following bolus administration of intravenous contrast. CONTRAST:   145mL OMNIPAQUE IOHEXOL  300 MG/ML  SOLN COMPARISON:  Ultrasound from earlier in the same day. FINDINGS: Lower chest: No acute abnormality. Hepatobiliary: No focal liver abnormality is seen. No gallstones, gallbladder wall thickening, or biliary dilatation. Pancreas: Unremarkable. No pancreatic ductal dilatation or surrounding inflammatory changes. Spleen: Normal in size without focal abnormality. Adrenals/Urinary Tract: Adrenal glands are within normal limits. Kidneys are well visualized bilaterally. No obstructive changes are seen. No calculi are noted. The bladder is well distended. Stomach/Bowel: The colon demonstrates diffuse thickening in the sigmoid colon consistent with diverticulitis. No area of perforation or focal abscess formation is noted. These changes correspond to that seen on recent ultrasound examination. The appendix has been surgically removed. Small bowel is within normal limits as is the stomach. Vascular/Lymphatic: No significant vascular findings are present. No enlarged abdominal or pelvic lymph nodes. Reproductive: The right ovary has been removed. The uterus and left ovary appear within normal limits. Other: No abdominal wall hernia or abnormality. No abdominopelvic ascites. Musculoskeletal: No acute or significant osseous findings. IMPRESSION: Changes consistent with diverticulitis in the sigmoid colon. No perforation or abscess formation is noted at this time. The changes correspond to that seen on recent ultrasound of the pelvis. Electronically Signed   By: Inez Catalina M.D.   On: 12/22/2018 16:29   US Pelvic Complete With Transvaginal  Result Date: 12/22/2018 CLINICAL DATA:  Low pelvic pain for the past 24 hours. Previous left oophorectomy and C-section. EXAM: TRANSABDOMINAL AND TRANSVAGINAL ULTRASOUND OF PELVIS TECHNIQUE: Both transabdominal and transvaginal ultrasound examinations of the pelvis were performed. Transabdominal technique was performed for global imaging of the pelvis  including uterus, ovaries, adnexal regions, and pelvic cul-de-sac. It was necessary to proceed with endovaginal exam following the transabdominal exam to visualize the uterus, right ovary and endometrial stripe in better detail. COMPARISON:  None FINDINGS: Uterus Measurements: 8.9 x 6.5 x 3.8 cm = volume: 116 mL. No fibroids or other mass visualized. Endometrium Thickness: 8.3 mm.  No focal abnormality visualized. Right ovary Measurements: 3.6 x 3.5 x 2.3 cm = volume: 15 mL. Normal appearance/no adnexal mass. Left ovary Surgically absent. Other findings Loop of bowel in the pelvic cul-de-sac with diffuse hypoechoic wall thickening with mild peristalsis. No free peritoneal fluid. IMPRESSION: 1. Mildly prominent loop of bowel in the pelvic cul-de-sac with diffuse hypoechoic wall thickening. This can be seen with enteritis. Ischemic changes are unlikely in this age group. This could be further evaluated with an abdomen pelvis CT with contrast. 2. Unremarkable uterus and right ovary. 3. Status post left oophorectomy. These results will be called to the ordering clinician or representative by the Radiologist Assistant, and communication documented in the PACS or zVision Dashboard. Electronically Signed   By: Claudie Revering M.D.   On: 12/22/2018 10:09    Procedures Procedures (including critical care time)  Medications Ordered in UC Medications  ketorolac (TORADOL) injection 60 mg (60 mg Intramuscular Given 12/22/18 0909)    Initial Impression / Assessment and Plan / UC Course  I have reviewed the triage vital signs and the nursing notes.  Pertinent labs & imaging results that were available during my care of the patient were reviewed by me and considered in my medical decision making (see chart for details).      Final Clinical Impressions(s) / UC Diagnoses   Final diagnoses:  Pelvic pain  Abnormal ultrasound of abdomen  Acute abdominal pain     Discharge Instructions     Recommend patient go  to Emergency Department for further evaluation  and management    ED Prescriptions    Medication Sig Dispense Auth. Provider   ciprofloxacin (CIPRO) 500 MG tablet Take 1 tablet (500 mg total) by mouth 2 (two) times daily. 6 tablet Norval Gable, MD   ketorolac (TORADOL) 10 MG tablet Take 1 tablet (10 mg total) by mouth every 8 (eight) hours as needed. 15 tablet Norval Gable, MD     1. Pelvic US results and diagnosis reviewed with patient; patient given toradol 60mg  IM x 1 with minimal improvement of pain; discussed with patient recommend patient go to Emergency Department for further evaluation 2. rx as per orders above; reviewed possible side effects, interactions, risks and benefits  3. Follow-up prn Controlled Substance Prescriptions [place] Controlled Substance Registry consulted? Not Applicable   Norval Gable, MD 12/22/18 [phone]

## 2018-12-22 NOTE — ED Notes (Signed)
reports abd pain more on left side. Pain began yesterday. Denies n/v/f/c/d

## 2018-12-22 NOTE — Discharge Instructions (Signed)
Take both antibiotics as prescribed and finish the full course.  You can take the Diflucan if needed, if you develop yeast infection symptoms.  You can use the tramadol as needed for pain (although you should take over-the-counter Tylenol or ibuprofen to the extent that you are able).  You can take the Zofran (ondansetron) as needed for nausea.  Return to the ER for new, worsening, persistent severe pain, vomiting, blood in the stool, fever, weakness, or any other new or worsening symptoms that concern you.

## 2018-12-22 NOTE — ED Notes (Signed)
Patient drinking contrast

## 2018-12-22 NOTE — ED Triage Notes (Signed)
Says she was at [facility] and they did an ultrasound for low abdominal pain--mostly on left side.  Says it showed abnormal bowel and they sent her here for ct scan.

## 2018-12-22 NOTE — ED Provider Notes (Signed)
[facility] Emergency Department Provider Note ____________________________________________   First MD Initiated Contact with Patient 12/22/18 1409     (approximate)  I have reviewed the triage vital signs and the nursing notes.   HISTORY  Chief Complaint Abdominal Pain    HPI Marissa Rose is a 43 y.o. female with PMH as noted below who presents with left lower abdominal pain since yesterday, not associated with nausea, vomiting, or diarrhea.  She has no fever, urinary symptoms, or vaginal bleeding.  The patient went to urgent care and had an ultrasound which showed no pelvic abnormalities but did show some thickened bowel loops so she was referred to the ED for further evaluation and possible CT.  Past Medical History:  Diagnosis Date  . 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 BMI 37.0-37.9, adult 12/07/2014  . S/P cesarean section 12/07/2014    Past Surgical History:  Procedure Laterality Date  . APPENDECTOMY    . CESAREAN SECTION     X 3  . CESAREAN SECTION WITH BILATERAL TUBAL LIGATION Right 12/07/2014   Procedure: CESAREAN SECTION WITH BILATERAL TUBAL LIGATION;  Surgeon: Will Bonnet, MD;  Location: [facility] ORS;  Service: Obstetrics;  Laterality: Right;  patient did not have left fallopian tube  . DILATION AND CURETTAGE OF UTERUS    . LEFT OOPHORECTOMY Left   . TONSILLECTOMY      Prior to Admission medications   Medication Sig Start Date End Date Taking? Authorizing Provider  azithromycin (ZITHROMAX) 250 MG tablet 1 tab daily by mouth 08/25/18   Delman Kitten, MD  ciprofloxacin (CIPRO) 500 MG tablet Take 1 tablet (500 mg total) by mouth 2 (two) times daily for 10 days. 12/22/18 01/01/19  Arta Silence, MD  cyanocobalamin (,VITAMIN B-12,) 1000 MCG/ML injection Inject 1 mL (1,000 mcg total) into the muscle every 30 (thirty) days. 09/23/17   Shambley, Melody N, CNM  fluconazole (DIFLUCAN) 150 MG  tablet Take 1 tablet (150 mg total) by mouth once for 1 dose. If you develop yeast infection symptoms after the antibiotics 12/22/18 12/22/18  Arta Silence, MD  ketorolac (TORADOL) 10 MG tablet Take 1 tablet (10 mg total) by mouth every 8 (eight) hours as needed. 12/22/18   Norval Gable, MD  metroNIDAZOLE (FLAGYL) 500 MG tablet Take 1 tablet (500 mg total) by mouth 3 (three) times daily for 10 days. 12/22/18 01/01/19  Arta Silence, MD  ondansetron (ZOFRAN ODT) 8 MG disintegrating tablet Take 1 tablet (8 mg total) by mouth every 8 (eight) hours as needed. 12/22/18   Arta Silence, MD  oseltamivir (TAMIFLU) 75 MG capsule Take 1 capsule (75 mg total) by mouth 2 (two) times daily. 08/20/18   Norval Gable, MD  phentermine (ADIPEX-P) 37.5 MG tablet Take 1 tablet (37.5 mg total) by mouth daily before breakfast. 02/19/18   Shambley, Melody N, CNM  traMADol (ULTRAM) 50 MG tablet Take 1 tablet (50 mg total) by mouth every 6 (six) hours as needed for up to 5 days. 12/22/18 12/27/18  Arta Silence, MD    Allergies Sulfa antibiotics  Family History  Problem Relation Age of Onset  . Hypertension Mother   . Diabetes Father     Social History Social History   Tobacco Use  . Smoking status: Former Smoker    Packs/day: 0.50    Types: Cigarettes    Quit date: 06/05/2003    Years since quitting: 15.5  .  Smokeless tobacco: Never Used  Substance Use Topics  . Alcohol use: Yes    Comment: occass  . Drug use: No    Review of Systems  Constitutional: No fever. Eyes: No redness. ENT: No sore throat. Cardiovascular: Denies chest pain. Respiratory: Denies shortness of breath. Gastrointestinal: No vomiting or diarrhea.  Genitourinary: Negative for dysuria.  Musculoskeletal: Negative for back pain. Skin: Negative for rash. Neurological: Negative for headache.   ____________________________________________   PHYSICAL EXAM:  VITAL SIGNS: ED Triage Vitals [12/22/18 1217]  Enc  Vitals Group     BP 120/75     Pulse Rate 94     Resp 14     Temp 98.4 F (36.9 C)     Temp Source Oral     SpO2 98 %     Weight      Height      Head Circumference      Peak Flow      Pain Score 3     Pain Loc      Pain Edu?      Excl. in [place]?     Constitutional: Alert and oriented. Well appearing and in no acute distress. Eyes: Conjunctivae are normal.  Head: Atraumatic. Nose: No congestion/rhinnorhea. Mouth/Throat: Mucous membranes are moist.   Neck: Normal range of motion.  Cardiovascular: Good peripheral circulation. Respiratory: Normal respiratory effort.  No retractions.  Gastrointestinal: Soft with mild left lower quadrant tenderness.  No distention.  Genitourinary: No flank tenderness. Musculoskeletal: Extremities warm and well perfused.  Neurologic:  Normal speech and language. No gross focal neurologic deficits are appreciated.  Skin:  Skin is warm and dry. No rash noted. Psychiatric: Mood and affect are normal. Speech and behavior are normal.  ____________________________________________   LABS (all labs ordered are listed, but only abnormal results are displayed)  Labs Reviewed  COMPREHENSIVE METABOLIC PANEL - Abnormal; Notable for the following components:      Result Value   Glucose, Bld 123 (*)    AST 72 (*)    ALT 104 (*)    Alkaline Phosphatase 181 (*)    All other components within normal limits  CBC - Abnormal; Notable for the following components:   WBC 12.2 (*)    All other components within normal limits   ____________________________________________  EKG   ____________________________________________  RADIOLOGY  CT abdomen: Acute diverticulitis with no evidence of abscess or perforation  ____________________________________________   PROCEDURES  Procedure(s) performed: No  Procedures  Critical Care performed: No ____________________________________________   INITIAL IMPRESSION / ASSESSMENT AND PLAN / ED COURSE   Pertinent labs & imaging results that were available during my care of the patient were reviewed by me and considered in my medical decision making (see chart for details).  43 year old female with PMH as noted above presents with left lower quadrant pain since yesterday.  The patient went to urgent care and had an ultrasound showing no pelvic abnormalities, but there was a significant thickened bowel loop visualized, and the radiologist recommended CT for further evaluation.  On exam the patient does have some mild tenderness in the left lower quadrant.  Lab work-up reveals elevated WBC count.  We will obtain a CT to evaluate for colitis, enteritis, diverticulitis, or other acute etiology.  ----------------------------------------- 4:51 PM on 12/22/2018 -----------------------------------------  CT shows findings consistent with acute diverticulitis, corresponding to what was seen on the ultrasound.  The patient has been tolerating p.o. and is afebrile.  She is stable for outpatient treatment.  I  will give a dose of Cipro and Flagyl here in the ED, and then continue the same at home.  I counseled her on the results of the work-up.  Return precautions given, and she expresses understanding.  ____________________________________________   FINAL CLINICAL IMPRESSION(S) / ED DIAGNOSES  Final diagnoses:  Diverticulitis      NEW MEDICATIONS STARTED DURING THIS VISIT:  New Prescriptions   CIPROFLOXACIN (CIPRO) 500 MG TABLET    Take 1 tablet (500 mg total) by mouth 2 (two) times daily for 10 days.   FLUCONAZOLE (DIFLUCAN) 150 MG TABLET    Take 1 tablet (150 mg total) by mouth once for 1 dose. If you develop yeast infection symptoms after the antibiotics   METRONIDAZOLE (FLAGYL) 500 MG TABLET    Take 1 tablet (500 mg total) by mouth 3 (three) times daily for 10 days.   ONDANSETRON (ZOFRAN ODT) 8 MG DISINTEGRATING TABLET    Take 1 tablet (8 mg total) by mouth every 8 (eight) hours as needed.    TRAMADOL (ULTRAM) 50 MG TABLET    Take 1 tablet (50 mg total) by mouth every 6 (six) hours as needed for up to 5 days.     Note:  This document was prepared using Dragon voice recognition software and may include unintentional dictation errors.    Arta Silence, MD 12/22/18 1651

## 2018-12-22 NOTE — ED Triage Notes (Signed)
Pt with sharp intermittent pelvic pain starting yesterday morning. No other sx. Pain 5/10

## 2018-12-22 NOTE — ED Notes (Signed)
Patient started po contrast for ct abd.

## 2018-12-22 NOTE — Discharge Instructions (Addendum)
Recommend patient go to Emergency Department for further evaluation and management °

## 2018-12-23 LAB — URINE CULTURE: Special Requests: NORMAL

## 2020-08-27 IMAGING — US US PELVIS COMPLETE WITH TRANSVAGINAL
1 series · 13 of 25 positions shown · non-contrast
Comparison: None

CLINICAL DATA: Low pelvic pain for the past 24 hours. Previous left
oophorectomy and C-section.

EXAM:
TRANSABDOMINAL AND TRANSVAGINAL ULTRASOUND OF PELVIS
TECHNIQUE: Both transabdominal and transvaginal ultrasound examinations of the
pelvis were performed. Transabdominal technique was performed for
global imaging of the pelvis including uterus, ovaries, adnexal
regions, and pelvic cul-de-sac. It was necessary to proceed with
endovaginal exam following the transabdominal exam to visualize the
uterus, right ovary and endometrial stripe in better detail.

[Series 1: us pelvis complete with transvaginal · 0.22mm/px · 93 acquisitions, 13 frames shown]
[im 1/93]
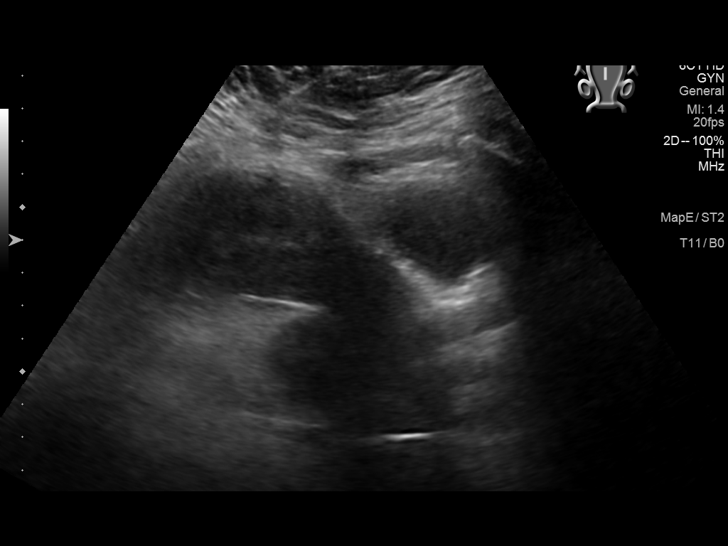
[im 8/93]
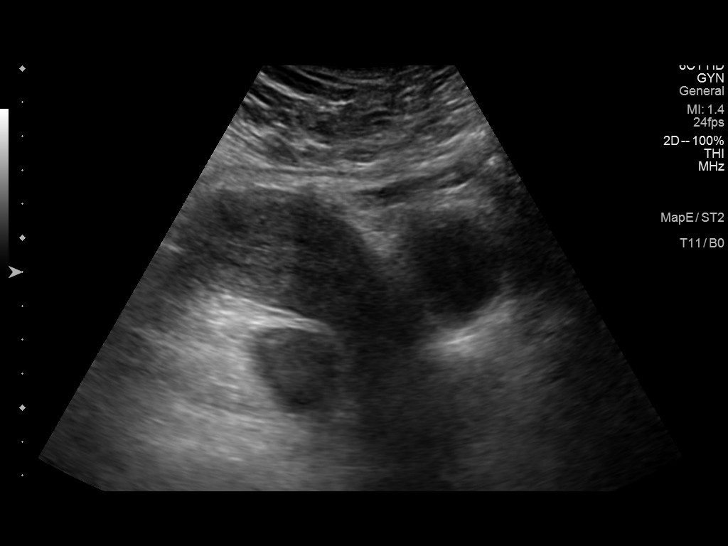
[im 16/93]
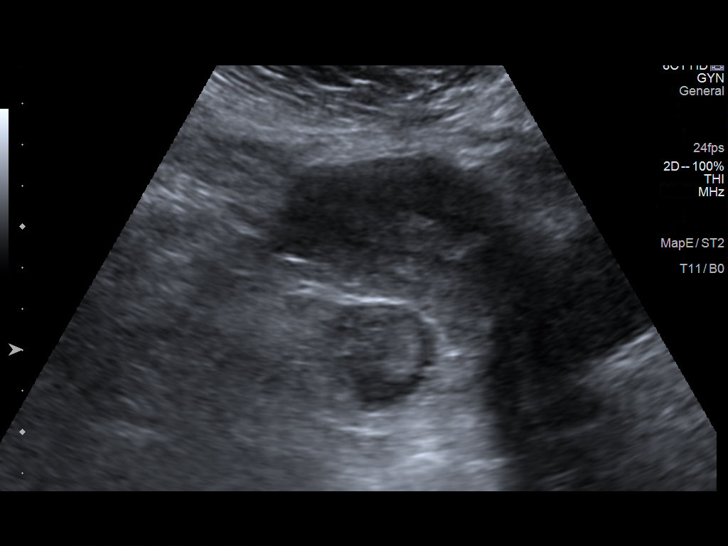
[im 24/93]
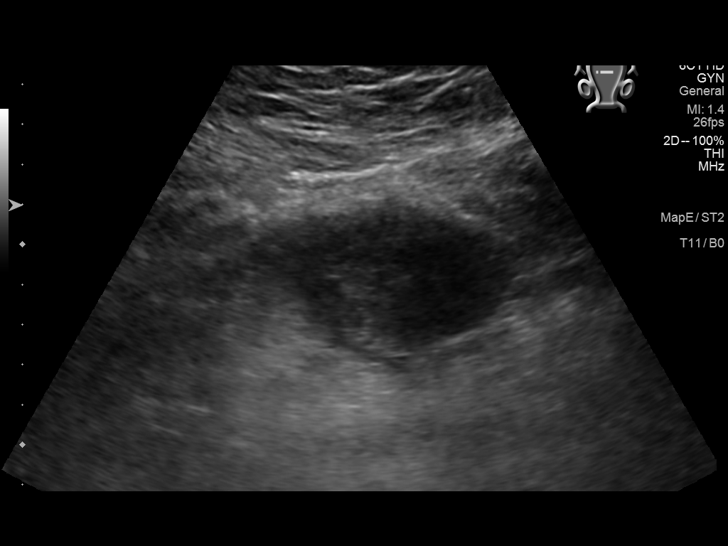
[im 31/93]
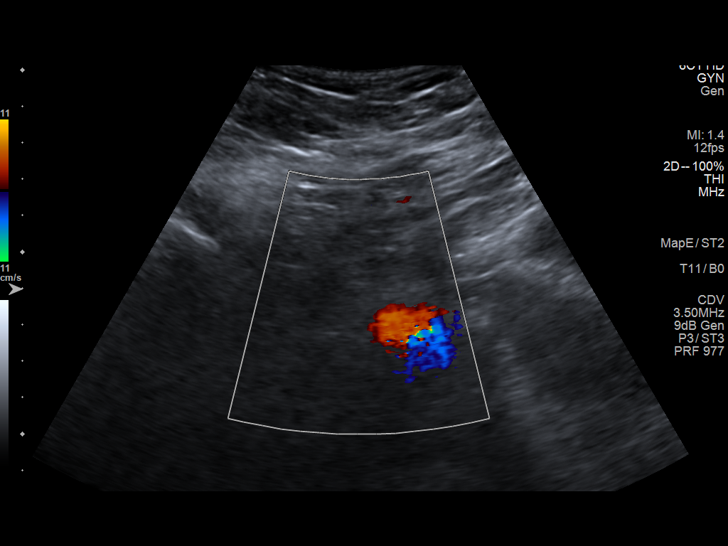
[im 39/93]
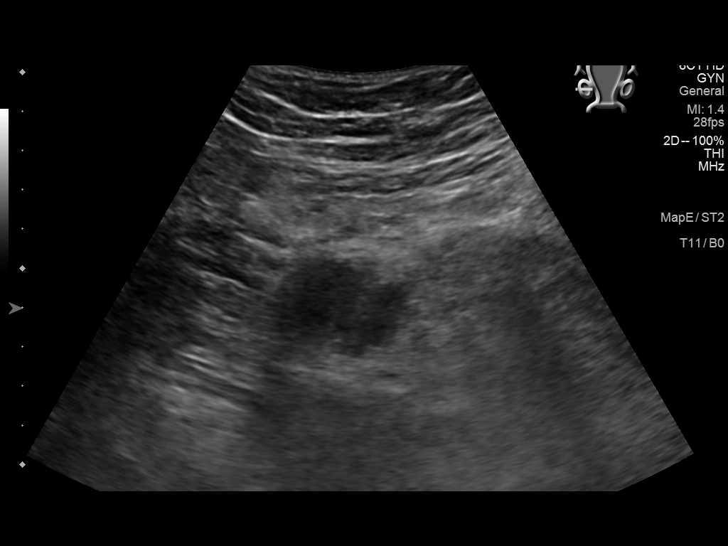
[im 47/93]
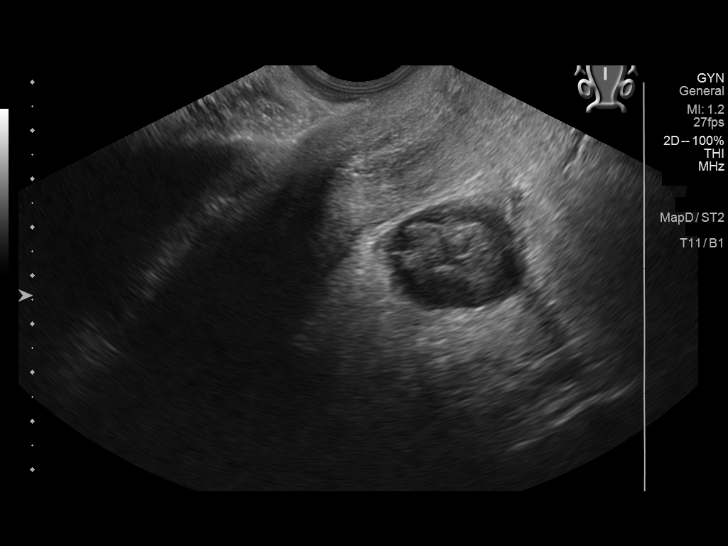
[im 54/93]
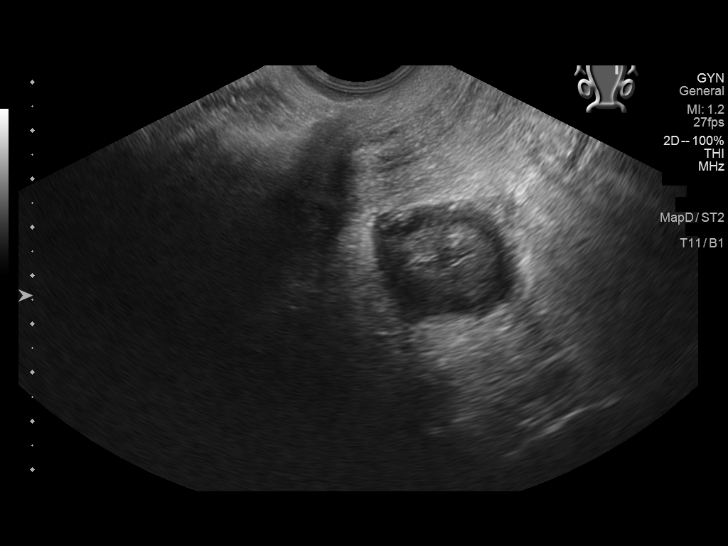
[im 62/93]
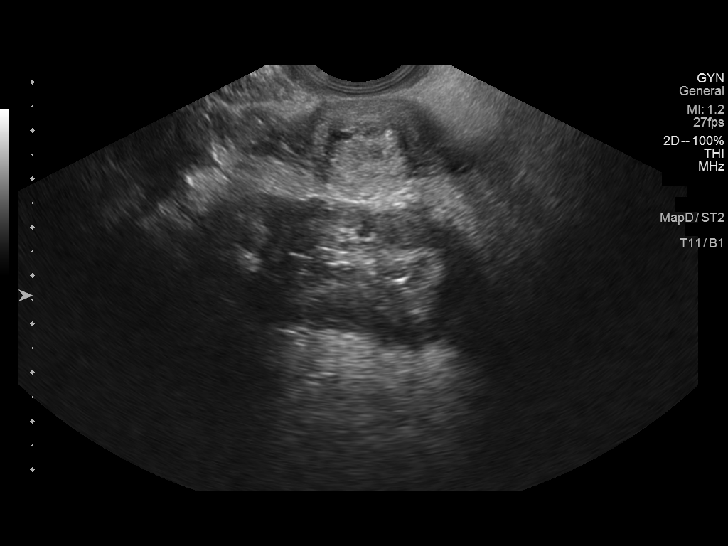
[im 70/93]
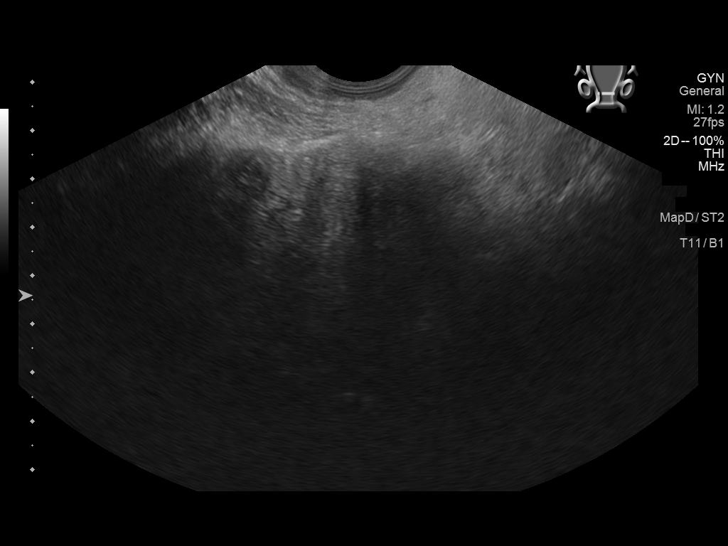
[im 77/93]
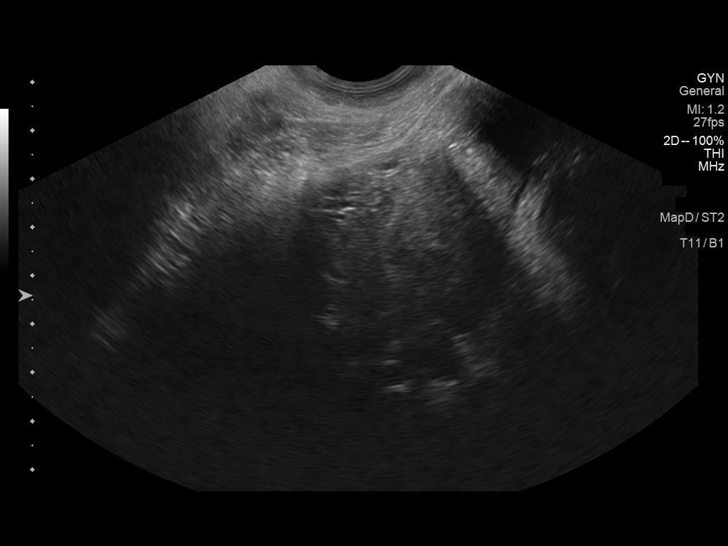
[im 85/93]
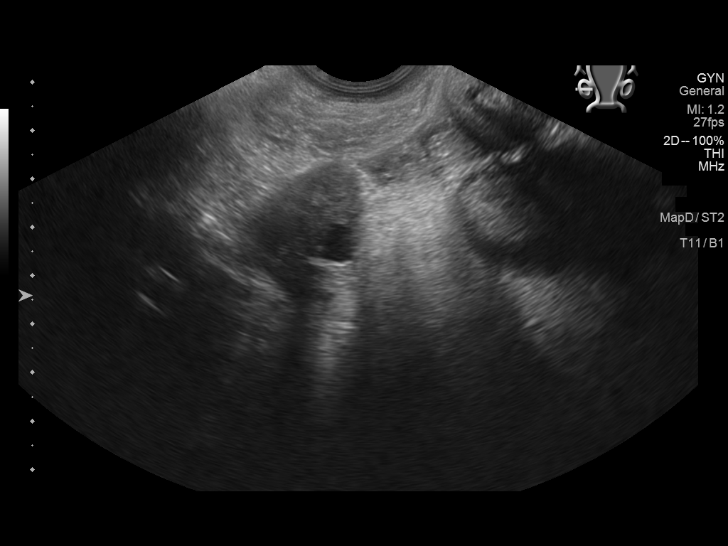
[im 93/93]
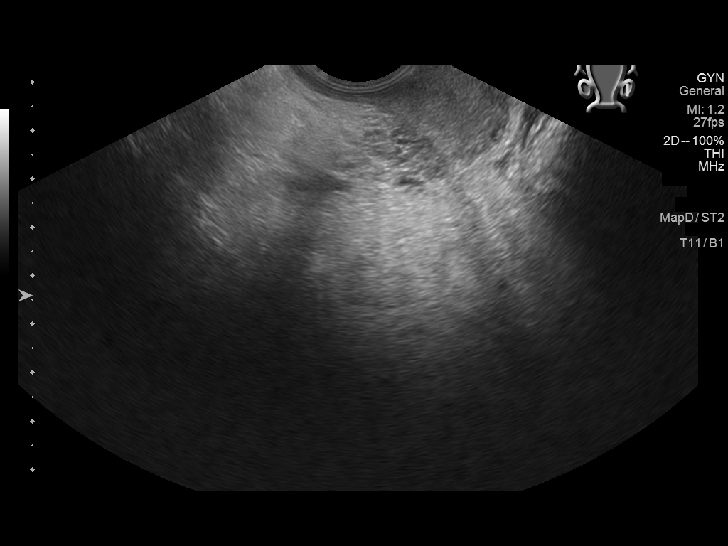

[13 of 25 positions shown; findings below may reference images not displayed]

FINDINGS: Uterus

Measurements: 8.9 x 6.5 x 3.8 cm = volume: 116 mL. No fibroids or
other mass visualized.

Endometrium

Thickness: 8.3 mm.  No focal abnormality visualized.

Right ovary

Measurements: 3.6 x 3.5 x 2.3 cm = volume: 15 mL. Normal
appearance/no adnexal mass.

Left ovary

Surgically absent.

Other findings

Loop of bowel in the pelvic cul-de-sac with diffuse hypoechoic wall
thickening with mild peristalsis. No free peritoneal fluid.
IMPRESSION: 1. Mildly prominent loop of bowel in the pelvic cul-de-sac with
diffuse hypoechoic wall thickening. This can be seen with enteritis.
Ischemic changes are unlikely in this age group. This could be
further evaluated with an abdomen pelvis CT with contrast.
2. Unremarkable uterus and right ovary.
3. Status post left oophorectomy.

These results will be called to the ordering clinician or
representative by the Radiologist Assistant, and communication
documented in the PACS or zVision Dashboard.

## 2021-03-12 ENCOUNTER — Other Ambulatory Visit: Payer: Self-pay

## 2021-03-12 ENCOUNTER — Ambulatory Visit (INDEPENDENT_AMBULATORY_CARE_PROVIDER_SITE_OTHER): Payer: PRIVATE HEALTH INSURANCE

## 2021-03-12 ENCOUNTER — Ambulatory Visit
Admission: EM | Admit: 2021-03-12 | Discharge: 2021-03-12 | Disposition: A | Discharge status: Home / Self Care | Payer: PRIVATE HEALTH INSURANCE | Attending: Emergency Medicine | Admitting: Emergency Medicine

## 2021-03-12 ENCOUNTER — Encounter: Payer: Self-pay | Admitting: Emergency Medicine

## 2021-03-12 DIAGNOSIS — Z87891 Personal history of nicotine dependence: Secondary | ICD-10-CM | POA: Diagnosis not present

## 2021-03-12 DIAGNOSIS — Z20822 Contact with and (suspected) exposure to covid-19: Secondary | ICD-10-CM | POA: Diagnosis not present

## 2021-03-12 DIAGNOSIS — R052 Subacute cough: Secondary | ICD-10-CM | POA: Insufficient documentation

## 2021-03-12 DIAGNOSIS — R0981 Nasal congestion: Secondary | ICD-10-CM | POA: Insufficient documentation

## 2021-03-12 DIAGNOSIS — R059 Cough, unspecified: Secondary | ICD-10-CM | POA: Diagnosis not present

## 2021-03-12 DIAGNOSIS — Z882 Allergy status to sulfonamides status: Secondary | ICD-10-CM | POA: Insufficient documentation

## 2021-03-12 LAB — INFLUENZA A AND B ANTIGEN (CONVERTED LAB)
INFLUENZA A ANTIGEN, POC: NEGATIVE
INFLUENZA B ANTIGEN, POC: NEGATIVE

## 2021-03-12 MED ORDER — IPRATROPIUM BROMIDE 0.06 % NA SOLN: 2.0000 | Freq: Four times a day (QID) | NASAL | 12 refills | Status: DC

## 2021-03-12 MED ORDER — BENZONATATE 100 MG PO CAPS: 200.0000 mg | ORAL_CAPSULE | Freq: Three times a day (TID) | ORAL | 0 refills | Status: DC

## 2021-03-12 NOTE — ED Provider Notes (Signed)
[facility]    CSN: [id number] Arrival date & time: 03/12/21  1447      History   Chief Complaint Chief Complaint  Patient presents with   Cough    HPI Marissa Rose is a 45 y.o. female.   HPI  44 year old female here for evaluation of cough.  Patient reports that she has been experiencing a dry cough for the last 2 months but in the last 3 days she started to get some mild sputum production.  She will feel it collect in her throat.  She also developed some nasal congestion this morning.  She denies fever, shortness breath or wheezing, ear pain, sore throat, GI complaints, or body aches.  Patient has been staying with her daughter in the hospital who is being treated for leukemia and she wants to be checked out to make sure that she is safe to return to her bedside tomorrow.  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  Past Medical History:  Diagnosis Date   Cancer [facility])    Basal Cell    Patient Active Problem List   Diagnosis Date Noted   BMI 37.0-37.9, adult 12/07/2014   S/P cesarean section 12/07/2014    Past Surgical History:  Procedure Laterality Date   APPENDECTOMY     CESAREAN SECTION     X 3   CESAREAN SECTION WITH BILATERAL TUBAL LIGATION Right 12/07/2014   Procedure: CESAREAN SECTION WITH BILATERAL TUBAL LIGATION;  Surgeon: Will Bonnet, MD;  Location: [facility] ORS;  Service: Obstetrics;  Laterality: Right;  patient did not have left fallopian tube   DILATION AND CURETTAGE OF UTERUS     LEFT OOPHORECTOMY Left    TONSILLECTOMY      OB History     Gravida  6   Para  4   Term  3   Preterm  0   AB  2   Living  4      SAB  1   IAB  0   Ectopic  1   Multiple  0   Live Births  4            Home Medications    Prior to Admission medications   Medication Sig Start Date End Date Taking? Authorizing Provider  benzonatate (TESSALON) 100 MG capsule Take 2 capsules (200 mg total) by mouth every 8 (eight)  hours. 03/12/21  Yes Margarette Canada, NP  ipratropium (ATROVENT) 0.06 % nasal spray Place 2 sprays into both nostrils 4 (four) times daily. 03/12/21  Yes Margarette Canada, NP    Family History Family History  Problem Relation Age of Onset   Hypertension Mother    Diabetes Father     Social History Social History   Tobacco Use   Smoking status: Former    Packs/day: 0.50    Types: Cigarettes    Quit date: 06/05/2003    Years since quitting: 17.7   Smokeless tobacco: Never  Vaping Use   Vaping Use: Never used  Substance Use Topics   Alcohol use: Yes    Comment: occass   Drug use: No     Allergies   Sulfa antibiotics   Review of Systems Review of Systems  Constitutional:  Negative for activity change, appetite change and fever.  HENT:  Positive for congestion. Negative for ear pain, rhinorrhea and sore throat.   Respiratory:  Positive for cough. Negative for shortness of breath and wheezing.   Gastrointestinal:  Negative for  diarrhea, nausea and vomiting.  Musculoskeletal:  Negative for arthralgias and myalgias.  Skin:  Negative for rash.  Hematological: Negative.   Psychiatric/Behavioral: Negative.      Physical Exam Triage Vital Signs ED Triage Vitals  Enc Vitals Group     BP 03/12/21 1512 131/89     Pulse Rate 03/12/21 1512 90     Resp 03/12/21 1512 15     Temp 03/12/21 1512 98.5 F (36.9 C)     Temp Source 03/12/21 1512 Oral     SpO2 03/12/21 1512 96 %     Weight 03/12/21 1509 225 lb (102.1 kg)     Height 03/12/21 1509 5\' 3"  (1.6 m)     Head Circumference --      Peak Flow --      Pain Score 03/12/21 1509 0     Pain Loc --      Pain Edu? --      Excl. in [place]? --    No data found.  Updated Vital Signs BP 131/89 (BP Location: Right Arm)   Pulse 90   Temp 98.5 F (36.9 C) (Oral)   Resp 15   Ht 5\' 3"  (1.6 m)   Wt 225 lb (102.1 kg)   LMP 02/14/2021 (Approximate)   SpO2 96%   BMI 39.86 kg/m   Visual Acuity Right Eye Distance:   Left Eye Distance:    Bilateral Distance:    Right Eye Near:   Left Eye Near:    Bilateral Near:     Physical Exam Vitals and nursing note reviewed.  Constitutional:      General: She is not in acute distress.    Appearance: Normal appearance. She is not ill-appearing.  HENT:     Head: Normocephalic and atraumatic.     Right Ear: Tympanic membrane, ear canal and external ear normal. There is no impacted cerumen.     Left Ear: Tympanic membrane, ear canal and external ear normal. There is no impacted cerumen.     Nose: Congestion and rhinorrhea present.     Mouth/Throat:     Mouth: Mucous membranes are moist.     Pharynx: Oropharynx is clear. No posterior oropharyngeal erythema.  Cardiovascular:     Rate and Rhythm: Normal rate and regular rhythm.     Pulses: Normal pulses.     Heart sounds: Normal heart sounds. No murmur heard.   No gallop.  Pulmonary:     Effort: Pulmonary effort is normal.     Breath sounds: Normal breath sounds. No wheezing, rhonchi or rales.  Musculoskeletal:     Cervical back: Normal range of motion and neck supple.  Lymphadenopathy:     Cervical: No cervical adenopathy.  Skin:    General: Skin is warm and dry.     Capillary Refill: Capillary refill takes less than 2 seconds.     Findings: No erythema or rash.  Neurological:     General: No focal deficit present.     Mental Status: She is alert and oriented to person, place, and time.  Psychiatric:        Mood and Affect: Mood normal.        Behavior: Behavior normal.        Thought Content: Thought content normal.        Judgment: Judgment normal.     UC Treatments / Results  Labs (all labs ordered are listed, but only abnormal results are displayed) Labs Reviewed  SARS CORONAVIRUS 2 (TAT 6-24  HRS)  INFLUENZA A AND B ANTIGEN (CONVERTED LAB)  POC INFLUENZA A AND B ANTIGEN (URGENT CARE ONLY)    EKG   Radiology DG Chest 2 View  Result Date: 03/12/2021 CLINICAL DATA:  Cough and chest congestion for 2 months  EXAM: CHEST - 2 VIEW COMPARISON:  08/25/2018 FINDINGS: Normal heart size, mediastinal contours, and pulmonary vascularity. Lungs clear. No pleural effusion or pneumothorax. Bones unremarkable. IMPRESSION: Normal exam. Electronically Signed   By: Lavonia Dana M.D.   On: 03/12/2021 15:36    Procedures Procedures (including critical care time)  Medications Ordered in UC Medications - No data to display  Initial Impression / Assessment and Plan / UC Course  I have reviewed the triage vital signs and the nursing notes.  Pertinent labs & imaging results that were available during my care of the patient were reviewed by me and considered in my medical decision making (see chart for details).  Patient very pleasant, nontoxic appearing 59-year-old female here for evaluation of cough that has been present for the last 2 months.  She has been staying with her daughter who is undergoing treatment for leukemia and states that there is a HEPA filter in the room which causes dry air and her cough has been mostly dry.  In the last 3 days she has had some sputum production and states that she feels like there is some phlegm caught in her throat.  She did develop nasal congestion this morning but states that she slept at home for the first time last night with a fan in her face and she thinks it might be related to that.  She is here primarily because she wants to make sure that she does not have anything infectious as she plans on returning to her daughter's bedside tomorrow.  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  She states she has had COVID 3 times.  Patient's physical exam reveals pearly gray tympanic membranes bilaterally with a normal light reflex and clear external auditory canals.  Nasal mucosa is erythematous and edematous with scant clear nasal discharge in both nares.  Oropharyngeal exam is benign.  No cervical lymphadenopathy appreciated on exam.  Cardiopulmonary exam reveals clear lung  sounds in all fields.  Will check rapid flu here in clinic and send out COVID swab for PCR evaluation.  We will also obtain chest x-ray to look for acute cardiopulmonary process.  Chest x-ray independently reviewed and evaluated by me.  Impression: The lung fields are well-pneumatized.  There is no evidence of effusion or infiltrate readily apparent on exam.  Radiology overread is pending. Radiology impression is negative for acute cardiopulmonary process.  Rapid influenza is negative.  COVID swab is pending.  Will discharge patient home with Tessalon Perles and Atrovent nasal spray to help her with her cough and nasal congestion.  The etiology of her cough is unclear though she does been most of her time in the hospital with her daughter so it may be something environmental that is aggravating her.  She also states that in her daughter's room there is a HEPA filter some may be the filtration system in the dryer that could be driving the patient's cough.   Final Clinical Impressions(s) / UC Diagnoses   Final diagnoses:  Subacute cough     Discharge Instructions      Use the Atrovent nasal spray, 2 squirts in each nostril every 6 hours, as needed for runny nose and postnasal drip.  Use the  Tessalon Perles every 8 hours during the day.  Take them with a small sip of water.  They may give you some numbness to the base of your tongue or a metallic taste in your mouth, this is normal.  Your COVID test will be back tomorrow.  If you test positive then we can prescribe the antiviral therapy to you.  If you are negative then there is no additional treatment to be performed.  Your symptoms may be a result of the dry air in the hospital and possible using saline nasal spray or even a humidifier might help alleviate some of her symptoms.  Return for reevaluation or see your primary care provider for any new or worsening symptoms.      ED Prescriptions     Medication Sig Dispense Auth.  Provider   benzonatate (TESSALON) 100 MG capsule Take 2 capsules (200 mg total) by mouth every 8 (eight) hours. 21 capsule Margarette Canada, NP   ipratropium (ATROVENT) 0.06 % nasal spray Place 2 sprays into both nostrils 4 (four) times daily. 15 mL Margarette Canada, NP      PDMP not reviewed this encounter.   Margarette Canada, NP 03/12/21 1546

## 2021-03-12 NOTE — ED Triage Notes (Signed)
Patient c/o cough and chest congestion for 2 months.  Patient denies fevers.

## 2021-03-12 NOTE — Discharge Instructions (Addendum)
Use the Atrovent nasal spray, 2 squirts in each nostril every 6 hours, as needed for runny nose and postnasal drip.  Use the Tessalon Perles every 8 hours during the day.  Take them with a small sip of water.  They may give you some numbness to the base of your tongue or a metallic taste in your mouth, this is normal.  Your COVID test will be back tomorrow.  If you test positive then we can prescribe the antiviral therapy to you.  If you are negative then there is no additional treatment to be performed.  Your symptoms may be a result of the dry air in the hospital and possible using saline nasal spray or even a humidifier might help alleviate some of her symptoms.  Return for reevaluation or see your primary care provider for any new or worsening symptoms.

## 2021-03-13 LAB — SARS CORONAVIRUS 2 (TAT 6-24 HRS): SARS Coronavirus 2: NEGATIVE

## 2022-04-23 ENCOUNTER — Encounter: Payer: Self-pay | Admitting: Adult Health

## 2022-04-23 ENCOUNTER — Other Ambulatory Visit: Payer: Self-pay | Admitting: Adult Health

## 2022-04-23 DIAGNOSIS — Z1231 Encounter for screening mammogram for malignant neoplasm of breast: Secondary | ICD-10-CM

## 2023-09-12 ENCOUNTER — Encounter: Payer: Self-pay | Admitting: Emergency Medicine

## 2023-09-12 ENCOUNTER — Ambulatory Visit
Admission: EM | Admit: 2023-09-12 | Discharge: 2023-09-12 | Disposition: A | Discharge status: Home / Self Care | Attending: Family Medicine | Admitting: Family Medicine

## 2023-09-12 DIAGNOSIS — R21 Rash and other nonspecific skin eruption: Secondary | ICD-10-CM | POA: Diagnosis not present

## 2023-09-12 MED ORDER — DEXAMETHASONE SODIUM PHOSPHATE 10 MG/ML IJ SOLN
10.0000 mg | Freq: Once | INTRAMUSCULAR | Status: AC
  Administered 2023-09-12: 10 mg via INTRAMUSCULAR

## 2023-09-12 MED ORDER — TRIAMCINOLONE ACETONIDE 0.1 % EX OINT: 1.0000 | TOPICAL_OINTMENT | Freq: Two times a day (BID) | CUTANEOUS | 0 refills | Status: DC

## 2023-09-12 NOTE — Discharge Instructions (Addendum)
 Stop by the pharmacy to pick up your prescriptions.  Follow up with your primary care provider or return to the urgent care, if not improving.

## 2023-09-12 NOTE — ED Triage Notes (Addendum)
 Pt presents with a painful and itchy rash on the left side of her neck and scalp started last night.

## 2023-09-12 NOTE — ED Provider Notes (Addendum)
 [facility]    CSN: [id number] Arrival date & time: 09/12/23  0855      History   Chief Complaint Chief Complaint  Patient presents with   Rash    HPI Marissa Rose is a 48 y.o. female.   HPI   Marissa Rose presents for red itchy rash on her neck and posterior scalp that started on Tuesday night. Took some Benadryl and Advil without relief.  Has slight pain.     Tried a new shampoo that she used before. No eye irritation, sore throat, difficulty breathing, nausea, vomiting or diarrhea.  Denies belly pain, joint pain and fever.  There has been no medication changes or new supplements.  Denies any new foods or drinks.     Past Medical History:  Diagnosis Date   Cancer [facility])    Basal Cell    Patient Active Problem List   Diagnosis Date Noted   BMI 37.0-37.9, adult 12/07/2014   S/P cesarean section 12/07/2014    Past Surgical History:  Procedure Laterality Date   APPENDECTOMY     CESAREAN SECTION     X 3   CESAREAN SECTION WITH BILATERAL TUBAL LIGATION Right 12/07/2014   Procedure: CESAREAN SECTION WITH BILATERAL TUBAL LIGATION;  Surgeon: Kris Pester, MD;  Location: [facility] ORS;  Service: Obstetrics;  Laterality: Right;  patient did not have left fallopian tube   DILATION AND CURETTAGE OF UTERUS     LEFT OOPHORECTOMY Left    TONSILLECTOMY      OB History     Gravida  6   Para  4   Term  3   Preterm  0   AB  2   Living  4      SAB  1   IAB  0   Ectopic  1   Multiple  0   Live Births  4            Home Medications    Prior to Admission medications   Medication Sig Start Date End Date Taking? Authorizing Provider  ketoconazole (NIZORAL) 2 % shampoo Apply topically 3 (three) times a week. 08/15/23  Yes [provider]  minoxidil (LONITEN) 2.5 MG tablet Take 2.5 mg by mouth daily. 08/15/23  Yes [provider]  phentermine (ADIPEX-P) 37.5 MG tablet Take by mouth. 08/21/23  Yes [provider]  promethazine  (PHENERGAN) 25 MG tablet Take by mouth. 01/10/17  Yes [provider]  spironolactone (ALDACTONE) 100 MG tablet Take 100 mg by mouth daily. 08/18/23  Yes [provider]  tirzepatide (ZEPBOUND) 7.5 MG/0.5ML injection vial Inject into the skin. 09/02/23  Yes [provider]  Topiramate ER (TROKENDI XR) 25 MG CP24 Take by mouth. 08/21/23  Yes [provider]  Vitamin D, Ergocalciferol, (DRISDOL) 1.25 MG (50000 UNIT) CAPS capsule Take by mouth. 08/21/23  Yes [provider]  doxycycline (VIBRAMYCIN) 100 MG capsule Take 1 capsule (100 mg total) by mouth 2 (two) times daily for 7 days. 09/17/23 09/24/23  Kent Pear, NP  valACYclovir (VALTREX) 1000 MG tablet Take 1 tablet (1,000 mg total) by mouth 3 (three) times daily. 09/17/23   Kent Pear, NP    Family History Family History  Problem Relation Age of Onset   Hypertension Mother    Diabetes Father     Social History Social History   Tobacco Use   Smoking status: Former    Current packs/day: 0.00    Types: Cigarettes    Quit date:  06/05/2003    Years since quitting: 20.3   Smokeless tobacco: Never  Vaping Use   Vaping status: Never Used  Substance Use Topics   Alcohol use: Yes    Comment: occass   Drug use: No     Allergies   Sulfa antibiotics   Review of Systems Review of Systems :negative unless otherwise stated in HPI.      Physical Exam Triage Vital Signs ED Triage Vitals  Encounter Vitals Group     BP 09/12/23 0915 119/81     Systolic BP Percentile --      Diastolic BP Percentile --      Pulse Rate 09/12/23 0915 78     Resp 09/12/23 0915 18     Temp 09/12/23 0915 97.8 F (36.6 C)     Temp Source 09/12/23 0915 Oral     SpO2 09/12/23 0915 100 %     Weight --      Height --      Head Circumference --      Peak Flow --      Pain Score 09/12/23 0912 2     Pain Loc --      Pain Education --      Exclude from Growth Chart --    No data found.  Updated Vital Signs BP  119/81 (BP Location: Left Arm)   Pulse 78   Temp 97.8 F (36.6 C) (Oral)   Resp 18   LMP 09/11/2023   SpO2 100%   Visual Acuity Right Eye Distance:   Left Eye Distance:   Bilateral Distance:    Right Eye Near:   Left Eye Near:    Bilateral Near:     Physical Exam  GEN: alert, well appearing female, in no acute distress  EYES: no scleral injection or discharge CV: regular rate, brisk cap refill RESP: no increased work of breathing MSK: no extremity edema  NEURO: alert, moves all extremities appropriately SKIN: warm and dry; erythematous patches with raised borders on left lateral neck extending to the base of the scalp     UC Treatments / Results  Labs (all labs ordered are listed, but only abnormal results are displayed) Labs Reviewed - No data to display  EKG   Radiology No results found.  Procedures Procedures (including critical care time)  Medications Ordered in UC Medications  dexamethasone (DECADRON) injection 10 mg (10 mg Intramuscular Given 09/12/23 0932)    Initial Impression / Assessment and Plan / UC Course  I have reviewed the triage vital signs and the nursing notes.  Pertinent labs & imaging results that were available during my care of the patient were reviewed by me and considered in my medical decision making (see chart for details).     Patient is a 48 y.o. femalewho presents for scalp and neck rash.  Overall, patient is well-appearing and well-hydrated.  Vital signs stable.  [name] is afebrile.  Exam concerning for contact dermatitis though I cannot rule out herpes zoster rash as he does not have the typical presentation at this time. Patient denies pain but reports pruritus.  Discussed oral versus IM steroids and she preferred IM steroids.  Decadron 10 mg given.  Treat with steroid ointment.  No sign of infection to suggest antibiotics or antifungals at this time. Patient advised to return, if spreading or rash becomes painful.   Reviewed  expectations regarding course of current medical issues.  All questions asked were answered.  Outlined signs and symptoms indicating  need for more acute intervention. Patient verbalized understanding. After Visit Summary given.   Final Clinical Impressions(s) / UC Diagnoses   Final diagnoses:  Rash     Discharge Instructions      Stop by the pharmacy to pick up your prescriptions.  Follow up with your primary care provider or return to the urgent care, if not improving.       ED Prescriptions     Medication Sig Dispense Auth. Provider   triamcinolone ointment (KENALOG) 0.1 % Apply 1 Application topically 2 (two) times daily. 30 g Kaydan Wilhoite, DO      PDMP not reviewed this encounter.      Tyasia Packard, DO 09/19/23 [id number]

## 2023-09-17 ENCOUNTER — Ambulatory Visit
Admission: EM | Admit: 2023-09-17 | Discharge: 2023-09-17 | Disposition: A | Discharge status: Home / Self Care | Attending: Emergency Medicine | Admitting: Emergency Medicine

## 2023-09-17 DIAGNOSIS — B029 Zoster without complications: Secondary | ICD-10-CM

## 2023-09-17 DIAGNOSIS — R21 Rash and other nonspecific skin eruption: Secondary | ICD-10-CM | POA: Diagnosis not present

## 2023-09-17 MED ORDER — DOXYCYCLINE HYCLATE 100 MG PO CAPS: 100.0000 mg | ORAL_CAPSULE | Freq: Two times a day (BID) | ORAL | 0 refills | Status: AC

## 2023-09-17 MED ORDER — VALACYCLOVIR HCL 1 G PO TABS: 1000.0000 mg | ORAL_TABLET | Freq: Three times a day (TID) | ORAL | 0 refills | Status: AC

## 2023-09-17 NOTE — ED Provider Notes (Signed)
 [facility]    CSN: [id number] Arrival date & time: 09/17/23  1222      History   Chief Complaint Chief Complaint  Patient presents with   Rash    HPI Marissa Rose is a 48 y.o. female.   HPI  48 year old female with past medical history significant for basal cell carcinoma presents for evaluation of worsening rash on the left side of her neck in her hairline.  She was first evaluated 5 days ago and was treated with topical triamcinolone.  She reports that the cream has not helped the rash and does appear to be worsening and spreading.  She now has a lesion on the anterior aspect of her neck.  She denies any fever or drainage.  She states that the rash is both itchy and painful.  Past Medical History:  Diagnosis Date   Cancer [facility])    Basal Cell    Patient Active Problem List   Diagnosis Date Noted   BMI 37.0-37.9, adult 12/07/2014   S/P cesarean section 12/07/2014    Past Surgical History:  Procedure Laterality Date   APPENDECTOMY     CESAREAN SECTION     X 3   CESAREAN SECTION WITH BILATERAL TUBAL LIGATION Right 12/07/2014   Procedure: CESAREAN SECTION WITH BILATERAL TUBAL LIGATION;  Surgeon: Conard Novak, MD;  Location: [facility] ORS;  Service: Obstetrics;  Laterality: Right;  patient did not have left fallopian tube   DILATION AND CURETTAGE OF UTERUS     LEFT OOPHORECTOMY Left    TONSILLECTOMY      OB History     Gravida  6   Para  4   Term  3   Preterm  0   AB  2   Living  4      SAB  1   IAB  0   Ectopic  1   Multiple  0   Live Births  4            Home Medications    Prior to Admission medications   Medication Sig Start Date End Date Taking? Authorizing Provider  doxycycline (VIBRAMYCIN) 100 MG capsule Take 1 capsule (100 mg total) by mouth 2 (two) times daily for 7 days. 09/17/23 09/24/23 Yes Becky Augusta, NP  valACYclovir (VALTREX) 1000 MG tablet Take 1 tablet (1,000 mg total) by mouth 3 (three) times daily. 09/17/23   Yes Becky Augusta, NP  ketoconazole (NIZORAL) 2 % shampoo Apply topically 3 (three) times a week. 08/15/23   [provider]  minoxidil (LONITEN) 2.5 MG tablet Take 2.5 mg by mouth daily. 08/15/23   [provider]  phentermine (ADIPEX-P) 37.5 MG tablet Take by mouth. 08/21/23   [provider]  promethazine (PHENERGAN) 25 MG tablet Take by mouth. 01/10/17   [provider]  spironolactone (ALDACTONE) 100 MG tablet Take 100 mg by mouth daily. 08/18/23   [provider]  tirzepatide (ZEPBOUND) 7.5 MG/0.5ML injection vial Inject into the skin. 09/02/23   [provider]  Topiramate ER (TROKENDI XR) 25 MG CP24 Take by mouth. 08/21/23   [provider]  Vitamin D, Ergocalciferol, (DRISDOL) 1.25 MG (50000 UNIT) CAPS capsule Take by mouth. 08/21/23   [provider]    Family History Family History  Problem Relation Age of Onset   Hypertension Mother    Diabetes Father     Social History Social History   Tobacco Use   Smoking status: Former    Current packs/day:  0.00    Types: Cigarettes    Quit date: 06/05/2003    Years since quitting: 20.2   Smokeless tobacco: Never  Vaping Use   Vaping status: Never Used  Substance Use Topics   Alcohol use: Yes    Comment: occass   Drug use: No     Allergies   Sulfa antibiotics   Review of Systems Review of Systems  Constitutional:  Negative for fever.  Skin:  Positive for color change and rash.  Hematological:  Positive for adenopathy.     Physical Exam Triage Vital Signs ED Triage Vitals  Encounter Vitals Group     BP      Systolic BP Percentile      Diastolic BP Percentile      Pulse      Resp      Temp      Temp src      SpO2      Weight      Height      Head Circumference      Peak Flow      Pain Score      Pain Loc      Pain Education      Exclude from Growth Chart    No data found.  Updated Vital Signs BP 120/86 (BP Location: Left Arm)   Pulse 83    Temp 98.6 F (37 C) (Oral)   Resp 16   LMP 09/11/2023   SpO2 97%   Visual Acuity Right Eye Distance:   Left Eye Distance:   Bilateral Distance:    Right Eye Near:   Left Eye Near:    Bilateral Near:     Physical Exam Vitals and nursing note reviewed.  Constitutional:      Appearance: Normal appearance. She is not ill-appearing.  HENT:     Head: Normocephalic and atraumatic.  Neck:     Comments: Left anterior cervical lymphadenopathy that is tender. Musculoskeletal:     Cervical back: Normal range of motion and neck supple. Tenderness present.  Lymphadenopathy:     Cervical: Cervical adenopathy present.  Skin:    General: Skin is warm and dry.     Capillary Refill: Capillary refill takes less than 2 seconds.     Findings: Erythema and rash present.  Neurological:     General: No focal deficit present.     Mental Status: She is alert and oriented to person, place, and time.      UC Treatments / Results  Labs (all labs ordered are listed, but only abnormal results are displayed) Labs Reviewed - No data to display  EKG   Radiology No results found.  Procedures Procedures (including critical care time)  Medications Ordered in UC Medications - No data to display  Initial Impression / Assessment and Plan / UC Course  I have reviewed the triage vital signs and the nursing notes.  Pertinent labs & imaging results that were available during my care of the patient were reviewed by me and considered in my medical decision making (see chart for details).   Patient is a pleasant, nontoxic-appearing 48 year old female presenting for evaluation of worsening rash on the left side of her neck as outlined HPI above.      Using images above, the rash follows along 1-2 dermatome levels and is located on the left side of the body only.  They are clusters of maculopapular lesions without overt vesicles.  The lesions are tender but they are not  warm to touch.  Differential  diagnosis includes shingles versus bacterial infection.  I will treat patient for both viral and potentially bacterial sources with Valtrex 1000 mg 3 times daily to cover for shingles and doxycycline 100 mg twice daily for 7 days to cover for potential staph or strep infection.  Return precautions reviewed.  Work note provided.   Final Clinical Impressions(s) / UC Diagnoses   Final diagnoses:  Herpes zoster without complication  Rash and nonspecific skin eruption     Discharge Instructions      Take the Valtrex 1000 mg 3 times a day for 7 days to treat possible shingles.  Muscular cover you for possible bacterial infection or secondary bacterial infection with doxycycline 100 mg twice daily with food for 7 days.  Use over-the-counter Tylenol and/or ibuprofen as needed for pain.  You may apply topical Benadryl cream to the lesions to help with itching.  You may also use oral Claritin, Zyrtec, or Allegra to help with itching during the day and Benadryl at night.  If you develop any worsening rash, develop pus drainage, or develop fevers please return for reevaluation.     ED Prescriptions     Medication Sig Dispense Auth. Provider   doxycycline (VIBRAMYCIN) 100 MG capsule Take 1 capsule (100 mg total) by mouth 2 (two) times daily for 7 days. 14 capsule Kent Pear, NP   valACYclovir (VALTREX) 1000 MG tablet Take 1 tablet (1,000 mg total) by mouth 3 (three) times daily. 21 tablet Kent Pear, NP      PDMP not reviewed this encounter.   Kent Pear, NP 09/17/23 1246

## 2023-09-17 NOTE — Discharge Instructions (Addendum)
 Take the Valtrex 1000 mg 3 times a day for 7 days to treat possible shingles.  Muscular cover you for possible bacterial infection or secondary bacterial infection with doxycycline 100 mg twice daily with food for 7 days.  Use over-the-counter Tylenol and/or ibuprofen as needed for pain.  You may apply topical Benadryl cream to the lesions to help with itching.  You may also use oral Claritin, Zyrtec, or Allegra to help with itching during the day and Benadryl at night.  If you develop any worsening rash, develop pus drainage, or develop fevers please return for reevaluation.

## 2023-09-17 NOTE — ED Triage Notes (Signed)
 Patient presents to [facility] for follow-up from 04/10 visit. States the rash is worse and painful. She was prescribed an ointment with no relief.
